# Patient Record
Sex: Male | Born: 1990
Health system: Southern US, Community
[De-identification: ages and names within clinical notes are randomized; demographics above are authoritative.]

## PROBLEM LIST (undated history)

## (undated) DIAGNOSIS — F419 Anxiety disorder, unspecified: Secondary | ICD-10-CM

## (undated) DIAGNOSIS — F988 Other specified behavioral and emotional disorders with onset usually occurring in childhood and adolescence: Secondary | ICD-10-CM

## (undated) HISTORY — DX: Other specified behavioral and emotional disorders with onset usually occurring in childhood and adolescence: F98.8

## (undated) HISTORY — DX: Anxiety disorder, unspecified: F41.9

## (undated) HISTORY — PX: MOUTH SURGERY: SHX715

## (undated) HISTORY — PX: FETAL SURGERY FOR CONGENITAL HERNIA: SHX1618

---
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
## 2009-10-25 ENCOUNTER — Ambulatory Visit: Payer: Self-pay | Admitting: Family Medicine

## 2010-07-10 ENCOUNTER — Ambulatory Visit: Payer: Self-pay | Admitting: Family Medicine

## 2013-02-09 ENCOUNTER — Other Ambulatory Visit (HOSPITAL_COMMUNITY): Payer: Self-pay | Admitting: Internal Medicine

## 2013-02-09 ENCOUNTER — Ambulatory Visit (HOSPITAL_COMMUNITY)
Admission: RE | Admit: 2013-02-09 | Discharge: 2013-02-09 | Disposition: A | Discharge status: Home / Self Care | Payer: BC Managed Care – PPO | Source: Ambulatory Visit | Attending: Internal Medicine | Admitting: Internal Medicine

## 2013-02-09 DIAGNOSIS — M79609 Pain in unspecified limb: Secondary | ICD-10-CM | POA: Insufficient documentation

## 2013-02-09 DIAGNOSIS — S92309A Fracture of unspecified metatarsal bone(s), unspecified foot, initial encounter for closed fracture: Secondary | ICD-10-CM

## 2014-07-01 DIAGNOSIS — F988 Other specified behavioral and emotional disorders with onset usually occurring in childhood and adolescence: Secondary | ICD-10-CM | POA: Insufficient documentation

## 2014-07-05 ENCOUNTER — Encounter: Payer: Self-pay | Admitting: Emergency Medicine

## 2014-07-06 ENCOUNTER — Ambulatory Visit (INDEPENDENT_AMBULATORY_CARE_PROVIDER_SITE_OTHER): Payer: BC Managed Care – PPO | Admitting: Internal Medicine

## 2014-07-06 ENCOUNTER — Encounter: Payer: Self-pay | Admitting: Internal Medicine

## 2014-07-06 VITALS — BP 118/68 | HR 72 | Temp 97.3°F | Resp 16 | Ht 71.0 in | Wt 123.6 lb

## 2014-07-06 DIAGNOSIS — Z0000 Encounter for general adult medical examination without abnormal findings: Secondary | ICD-10-CM

## 2014-07-06 DIAGNOSIS — R7401 Elevation of levels of liver transaminase levels: Secondary | ICD-10-CM

## 2014-07-06 DIAGNOSIS — R740 Nonspecific elevation of levels of transaminase and lactic acid dehydrogenase [LDH]: Secondary | ICD-10-CM

## 2014-07-06 DIAGNOSIS — F17200 Nicotine dependence, unspecified, uncomplicated: Secondary | ICD-10-CM

## 2014-07-06 DIAGNOSIS — F988 Other specified behavioral and emotional disorders with onset usually occurring in childhood and adolescence: Secondary | ICD-10-CM

## 2014-07-06 DIAGNOSIS — E559 Vitamin D deficiency, unspecified: Secondary | ICD-10-CM

## 2014-07-06 DIAGNOSIS — Z79899 Other long term (current) drug therapy: Secondary | ICD-10-CM

## 2014-07-06 DIAGNOSIS — Z1212 Encounter for screening for malignant neoplasm of rectum: Secondary | ICD-10-CM

## 2014-07-06 DIAGNOSIS — R7402 Elevation of levels of lactic acid dehydrogenase (LDH): Secondary | ICD-10-CM

## 2014-07-06 DIAGNOSIS — Z113 Encounter for screening for infections with a predominantly sexual mode of transmission: Secondary | ICD-10-CM

## 2014-07-06 MED ORDER — AMPHETAMINE-DEXTROAMPHET ER 30 MG PO CP24: 30.0000 mg | ORAL_CAPSULE | Freq: Every day | ORAL | Status: DC

## 2014-07-06 MED ORDER — VARENICLINE TARTRATE 1 MG PO TABS: 1.0000 mg | ORAL_TABLET | Freq: Two times a day (BID) | ORAL | Status: DC

## 2014-07-06 NOTE — Patient Instructions (Addendum)
Recommend the book "The END of DIETING" by Dr Baker Janus   and the book "The END of DIABETES " by Dr Excell Seltzer  At [email or web address] - get book & Audio CD's      Being diabetic has a  300% increased risk for heart attack, stroke, cancer, and alzheimer- type vascular dementia. It is very important that you work harder with diet by avoiding all foods that are white except chicken & fish. Avoid white rice (brown & wild rice is OK), white potatoes (sweetpotatoes in moderation is OK), White bread or wheat bread or anything made out of white flour like bagels, donuts, rolls, buns, biscuits, cakes, pastries, cookies, pizza crust, and pasta (made from white flour & egg whites) - vegetarian pasta or spinach or wheat pasta is OK. Multigrain breads like Arnold's or [employer], or multigrain sandwich thins or flatbreads.  Diet, exercise and weight loss can reverse and cure diabetes in the early stages.  Diet, exercise and weight loss is very important in the control and prevention of complications of diabetes which affects every system in your body, ie. Brain - dementia/stroke, eyes - glaucoma/blindness, heart - heart attack/heart failure, kidneys - dialysis, stomach - gastric paralysis, intestines - malabsorption, nerves - severe painful neuritis, circulation - gangrene & loss of a leg(s), and finally cancer and Alzheimers.    I recommend avoid fried & greasy foods,  sweets/candy, white rice (brown or wild rice or Quinoa is OK), white potatoes (sweet potatoes are OK) - anything made from white flour - bagels, doughnuts, rolls, buns, biscuits,white and wheat breads, pizza crust and traditional pasta made of white flour & egg white(vegetarian pasta or spinach or wheat pasta is OK).  Multi-grain bread is OK - like multi-grain flat bread or sandwich thins. Avoid alcohol in excess. Exercise is also important.    Eat all the vegetables you want - avoid meat, especially red meat and dairy - especially cheese.  Cheese  is the most concentrated form of trans-fats which is the worst thing to clog up our arteries. Veggie cheese is OK which can be found in the fresh produce section at [employer] or [employer] or [employer] 51-24 Years Old SCHOOL PERFORMANCE After high school, you may attend college or technical or vocational school, enroll in the [employer], or enter the workforce. PHYSICAL, SOCIAL, AND EMOTIONAL DEVELOPMENT  One hour of regular physical activity daily is recommended. Continue to participate in sports.  Develop your own interests and consider community service or volunteerism.  Make decisions about college and work plans.  Throughout these years, you should assume responsibility for your own health care. Increasing independence is important for you.  You may be exploring your sexual identity. Understand that you should never be in a situation that makes you feel uncomfortable, and tell your partner if you do not want to engage in sexual activity.  Body image may become important to you. Be mindful that eating disorders can develop at this time. Talk to your parents or other caregivers if you have concerns about body image, weight gain, or losing weight.  You may notice mood disturbances, depression, anxiety, attention problems, or trouble with alcohol. Talk to your health care provider if you have concerns about mental illness.  Set limits for yourself and talk with your parents or other caregivers about independent decision making.  Handle conflict without physical violence.  Avoid loud noises which may impair hearing.  Limit television and computer time to 2  hours each day. Individuals who engage in excessive inactivity are more likely to become overweight. RECOMMENDED IMMUNIZATIONS  Influenza vaccine.  All adults should be immunized every year.  All adults, including pregnant women and people with hives-only allergy to eggs, can receive the inactivated  influenza (IIV) vaccine.  Adults aged 18-49 years can receive the recombinant influenza (RIV) vaccine. The RIV vaccine does not contain any egg protein.  Tetanus, diphtheria, and acellular pertussis (Td, Tdap) vaccine.  Pregnant women should receive 1 dose of Tdap vaccine during each pregnancy. The dose should be obtained regardless of the length of time since the last dose. Immunization is preferred during the 27th to 36th week of gestation.  An adult who has not previously received Tdap or who does not know his or her vaccine status should receive 1 dose of Tdap. This initial dose should be followed by tetanus and diphtheria toxoids (Td) booster doses every 10 years.  Adults with an unknown or incomplete history of completing a 3-dose immunization series with Td-containing vaccines should begin or complete a primary immunization series including a Tdap dose.  Adults should receive a Td booster every 10 years.  Varicella vaccine.  An adult without evidence of immunity to varicella should receive 2 doses or a second dose if he or she has previously received 1 dose.  Pregnant females who do not have evidence of immunity should receive the first dose after pregnancy. This first dose should be obtained before leaving the health care facility. The second dose should be obtained 4-8 weeks after the first dose.  Human papillomavirus (HPV) vaccine.  Females aged 13-26 years who have not received the vaccine previously should obtain the 3-dose series.  The vaccine is not recommended for pregnant females. However, pregnancy testing is not needed before receiving a dose. If a male is found to be pregnant after receiving a dose, no treatment is needed. In that case, the remaining doses should be delayed until after the pregnancy.  Males aged 46-21 years who have not received the vaccine previously should receive the 3-dose series. Males aged 22-26 years may be immunized.  Immunization is  recommended through the age of 74 years for any male who has sex with males and did not get any or all doses earlier.  Immunization is recommended for any person with an immunocompromised condition through the age of 45 years if he or she did not get any or all doses earlier.  During the 3-dose series, the second dose should be obtained 4-8 weeks after the first dose. The third dose should be obtained 24 weeks after the first dose and 16 weeks after the second dose.  Measles, mumps, and rubella (MMR) vaccine.  Adults born in [birth year] or later should have 1 or more doses of MMR vaccine unless there is a contraindication to the vaccine or there is laboratory evidence of immunity to each of the three diseases.  A routine second dose of MMR vaccine should be obtained at least 28 days after the first dose for students attending postsecondary schools, health care workers, and international travelers.  For females of childbearing age, rubella immunity should be determined. If there is no evidence of immunity, females who are not pregnant should be vaccinated. If there is no evidence of immunity, females who are pregnant should delay immunization until after pregnancy.  Pneumococcal 13-valent conjugate (PCV13) vaccine.  When indicated, a person who is uncertain of his or her immunization history and has no record of immunization should  receive the PCV13 vaccine.  An adult aged 86 years or older who has certain medical conditions and has not been previously immunized should receive 1 dose of PCV13 vaccine. This PCV13 should be followed with a dose of pneumococcal polysaccharide (PPSV23) vaccine. The PPSV23 vaccine dose should be obtained at least 8 weeks after the dose of PCV13 vaccine.  An adult aged 87 years or older who has certain medical conditions and previously received 1 or more doses of PPSV23 vaccine should receive 1 dose of PCV13. The PCV13 vaccine dose should be obtained 1 or more years after the  last PPSV23 vaccine dose.  Pneumococcal polysaccharide (PPSV23) vaccine.  When PCV13 is also indicated, PCV13 should be obtained first.  An adult younger than age 66 years who has certain medical conditions should be immunized.  Any person who resides in a long-term care facility should be immunized.  An adult smoker should be immunized.  People with an immunocompromised condition and certain other conditions should receive both PCV13 and PPSV23 vaccines.  People with human immunodeficiency virus (HIV) infection should be immunized as soon as possible after diagnosis.  Immunization during chemotherapy or radiation therapy should be avoided.  Routine use of PPSV23 vaccine is not recommended for American Indians, [place] Natives, or people younger than 65 years unless there are medical conditions that require PPSV23 vaccine.  When indicated, people who have unknown immunization and have no record of immunization should receive PPSV23 vaccine.  One-time revaccination 5 years after the first dose of PPSV23 is recommended for people aged 19-64 years who have chronic kidney failure, nephrotic syndrome, asplenia, or immunocompromised conditions.  Meningococcal vaccine.  Adults with asplenia or persistent complement component deficiencies should receive 2 doses of quadrivalent meningococcal conjugate (MenACWY-D) vaccine. The doses should be obtained at least 2 months apart.  Microbiologists working with certain meningococcal bacteria, [place] recruits, people at risk during an outbreak, and people who travel to or live in countries with a high rate of meningitis should be immunized.  A first-year college student up through age 63 years who is living in a residence hall should receive a dose if he or she did not receive a dose on or after his or her 16th birthday.  Adults who have certain high-risk conditions should receive one or more doses of vaccine.  Hepatitis A vaccine.  Adults who  wish to be protected from this disease, have certain high-risk conditions, work with hepatitis A-infected animals, work in hepatitis A research labs, or travel to or work in countries with a high rate of hepatitis A should be immunized.  Adults who were previously unvaccinated and who anticipate close contact with an international adoptee during the first 60 days after arrival in the [place] from a country with a high rate of hepatitis A should be immunized.  Hepatitis B vaccine.  Adults who wish to be protected from this disease, have certain high-risk conditions, may be exposed to blood or other infectious body fluids, are household contacts or sex partners of hepatitis B positive people, are clients or workers in certain care facilities, or travel to or work in countries with a high rate of hepatitis B should be immunized.  Haemophilus influenzae type b (Hib) vaccine.  A previously unvaccinated person with asplenia or sickle cell disease or having a scheduled splenectomy should receive 1 dose of Hib vaccine.  Regardless of previous immunization, a recipient of a hematopoietic stem cell transplant should receive a 3-dose series 6-12 months after his or  her successful transplant.  Hib vaccine is not recommended for adults with HIV infection. TESTING  Annual screening for vision and hearing problems is recommended. Vision should be screened at least once between 39-57 years of age.  You may be screened for anemia or tuberculosis.  You should have a blood test to check for high cholesterol.  You should be screened for alcohol and drug use.  If you are sexually active, you may be screened for sexually transmitted infections (STIs), pregnancy, or HIV. You should be screened for STIs if:  Your sexual activity has changed since the last screening test, and you are at an increased risk for chlamydia or gonorrhea. Ask your health care provider if you are at risk.  If you are at an  increased risk for hepatitis B, you should be screened for this virus. You are considered at high risk for hepatitis B if you:  Were born in a country where hepatitis B occurs often. Talk with your health care provider about which countries are considered high risk.  Have parents who were born in a high-risk country and have not received a shot to protect against hepatitis B (hepatitis B vaccine).  Have HIV or AIDS.  Use needles to inject street drugs.  Live with or have sex with someone who has hepatitis B.  Are a man who has sex with other men (MSM).  Get hemodialysis treatment.  Take certain medicines for conditions like cancer, organ transplantation, or autoimmune conditions. NUTRITION   You should:  Have three servings of low-fat milk and dairy products daily. If you do not drink milk or consume dairy products, you should eat calcium-enriched foods, such as juice, bread, or cereal. Dark, leafy greens or canned fish are alternate sources of calcium.  Drink plenty of water. Fruit juice should be limited to 8-12 oz (240-360 mL) each day. Sugary beverages and sodas should be avoided.  Avoid eating foods high in fat, salt, or sugar, such as chips, candy, and cookies.  Avoid fast foods and limit eating out at restaurants.  Try not to skip meals, especially breakfast. You should eat a variety of vegetables, fruits, and lean meats.  Eat meals together as a family whenever possible. ORAL HEALTH Brush your teeth twice a day and floss at least once a day. You should have two dental exams a year.  SKIN CARE You should wear sunscreen when out in the sun. TALK TO SOMEONE ABOUT:  Precautions against pregnancy, contraception, and sexually transmitted infections.  Taking a prescription medicine daily to prevent HIV infection if you are at risk of being infected with HIV. This is called preexposure prophylaxis (PrEP). You are at risk if you:  Are a male who has sex with other males  (MSM).  Are heterosexual and sexually active with more than one partner.  Take drugs by injection.  Are sexually active with a partner who has HIV.  Whether you are at high risk of being infected with HIV. If you choose to begin PrEP, you should first be tested for HIV. You should then be tested every 3 months for as long as you are taking PrEP.  Drug, tobacco, and alcohol use among your friends or at friends' homes. Smoking tobacco or marijuana and taking drugs have health consequences and may impact your brain development.  Appropriate use of over-the-counter or prescription medicines.  Driving guidelines and riding with friends.  The risks of drinking and driving or boating. Call someone if you have been drinking  or using drugs and need a ride. WHAT'S NEXT? Visit your pediatrician or family physician once a year. By young adulthood, you should transition from your pediatrician to a family physician or internal medicine specialist. If you are a male and are sexually active, you may want to begin annual physical exams with a gynecologist. Document Released: 01/29/2007 Document Revised: 11/08/2013 Document Reviewed: 02/18/2007 [facility] Patient Information 2015 [place], [place]. This information is not intended to replace advice given to you by your health care provider. Make sure you discuss any questions you have with your health care provider.   Smoking Cessation Quitting smoking is important to your health and has many advantages. However, it is not always easy to quit since nicotine is a very addictive drug. Oftentimes, people try 3 times or more before being able to quit. This document explains the best ways for you to prepare to quit smoking. Quitting takes hard work and a lot of effort, but you can do it. ADVANTAGES OF QUITTING SMOKING  You will live longer, feel better, and live better.  Your body will feel the impact of quitting smoking almost immediately.  Within 20 minutes,  blood pressure decreases. Your pulse returns to its normal level.  After 8 hours, carbon monoxide levels in the blood return to normal. Your oxygen level increases.  After 24 hours, the chance of having a heart attack starts to decrease. Your breath, hair, and body stop smelling like smoke.  After 48 hours, damaged nerve endings begin to recover. Your sense of taste and smell improve.  After 72 hours, the body is virtually free of nicotine. Your bronchial tubes relax and breathing becomes easier.  After 2 to 12 weeks, lungs can hold more air. Exercise becomes easier and circulation improves.  The risk of having a heart attack, stroke, cancer, or lung disease is greatly reduced.  After 1 year, the risk of coronary heart disease is cut in half.  After 5 years, the risk of stroke falls to the same as a nonsmoker.  After 10 years, the risk of lung cancer is cut in half and the risk of other cancers decreases significantly.  After 15 years, the risk of coronary heart disease drops, usually to the level of a nonsmoker.  If you are pregnant, quitting smoking will improve your chances of having a healthy baby.  The people you live with, especially any children, will be healthier.  You will have extra money to spend on things other than cigarettes. QUESTIONS TO THINK ABOUT BEFORE ATTEMPTING TO QUIT You may want to talk about your answers with your health care provider.  Why do you want to quit?  If you tried to quit in the past, what helped and what did not?  What will be the most difficult situations for you after you quit? How will you plan to handle them?  Who can help you through the tough times? Your family? Friends? A health care provider?  What pleasures do you get from smoking? What ways can you still get pleasure if you quit? Here are some questions to ask your health care provider:  How can you help me to be successful at quitting?  What medicine do you think would be best  for me and how should I take it?  What should I do if I need more help?  What is smoking withdrawal like? How can I get information on withdrawal? GET READY  Set a quit date.  Change your environment by getting rid  of all cigarettes, ashtrays, matches, and lighters in your home, car, or work. Do not let people smoke in your home.  Review your past attempts to quit. Think about what worked and what did not. GET SUPPORT AND ENCOURAGEMENT You have a better chance of being successful if you have help. You can get support in many ways.  Tell your family, friends, and coworkers that you are going to quit and need their support. Ask them not to smoke around you.  Get individual, group, or telephone counseling and support. Programs are available at [employer] and health centers. Call your local health department for information about programs in your area.  Spiritual beliefs and practices may help some smokers quit.  Download a "quit meter" on your computer to keep track of quit statistics, such as how long you have gone without smoking, cigarettes not smoked, and money saved.  Get a self-help book about quitting smoking and staying off tobacco. [place] yourself from urges to smoke. Talk to someone, go for a walk, or occupy your time with a task.  Change your normal routine. Take a different route to work. Drink tea instead of coffee. Eat breakfast in a different place.  Reduce your stress. Take a hot bath, exercise, or read a book.  Plan something enjoyable to do every day. Reward yourself for not smoking.  Explore interactive web-based programs that specialize in helping you quit. GET MEDICINE AND USE IT CORRECTLY Medicines can help you stop smoking and decrease the urge to smoke. Combining medicine with the above behavioral methods and support can greatly increase your chances of successfully quitting smoking.  Nicotine replacement therapy helps  deliver nicotine to your body without the negative effects and risks of smoking. Nicotine replacement therapy includes nicotine gum, lozenges, inhalers, nasal sprays, and skin patches. Some may be available over-the-counter and others require a prescription.  Antidepressant medicine helps people abstain from smoking, but how this works is unknown. This medicine is available by prescription.  Nicotinic receptor partial agonist medicine simulates the effect of nicotine in your brain. This medicine is available by prescription. Ask your health care provider for advice about which medicines to use and how to use them based on your health history. Your health care provider will tell you what side effects to look out for if you choose to be on a medicine or therapy. Carefully read the information on the package. Do not use any other product containing nicotine while using a nicotine replacement product.  RELAPSE OR DIFFICULT SITUATIONS Most relapses occur within the first 3 months after quitting. Do not be discouraged if you start smoking again. Remember, most people try several times before finally quitting. You may have symptoms of withdrawal because your body is used to nicotine. You may crave cigarettes, be irritable, feel very hungry, cough often, get headaches, or have difficulty concentrating. The withdrawal symptoms are only temporary. They are strongest when you first quit, but they will go away within 10-14 days. To reduce the chances of relapse, try to:  Avoid drinking alcohol. Drinking lowers your chances of successfully quitting.  Reduce the amount of caffeine you consume. Once you quit smoking, the amount of caffeine in your body increases and can give you symptoms, such as a rapid heartbeat, sweating, and anxiety.  Avoid smokers because they can make you want to smoke.  Do not let weight gain distract you. Many smokers will gain weight when they quit, usually less than  10 pounds. Eat a  healthy diet and stay active. You can always lose the weight gained after you quit.  Find ways to improve your mood other than smoking. FOR MORE INFORMATION  www.smokefree.gov  Document Released: 10/28/2001 Document Revised: 03/20/2014 Document Reviewed: 02/12/2012 [facility] Patient Information 2015 [place], [place]. This information is not intended to replace advice given to you by your health care provider. Make sure you discuss any questions you have with your health care provider.  Smoking Cessation, Tips for Success If you are ready to quit smoking, congratulations! You have chosen to help yourself be healthier. Cigarettes bring nicotine, tar, carbon monoxide, and other irritants into your body. Your lungs, heart, and blood vessels will be able to work better without these poisons. There are many different ways to quit smoking. Nicotine gum, nicotine patches, a nicotine inhaler, or nicotine nasal spray can help with physical craving. Hypnosis, support groups, and medicines help break the habit of smoking. WHAT THINGS CAN I DO TO MAKE QUITTING EASIER?  Here are some tips to help you quit for good:  Pick a date when you will quit smoking completely. Tell all of your friends and family about your plan to quit on that date.  Do not try to slowly cut down on the number of cigarettes you are smoking. Pick a quit date and quit smoking completely starting on that day.  Throw away all cigarettes.   Clean and remove all ashtrays from your home, work, and car.  On a card, write down your reasons for quitting. Carry the card with you and read it when you get the urge to smoke.  Cleanse your body of nicotine. Drink enough water and fluids to keep your urine clear or pale yellow. Do this after quitting to flush the nicotine from your body.  Learn to predict your moods. Do not let a bad situation be your excuse to have a cigarette. Some situations in your life might tempt you into wanting a  cigarette.  Never have "just one" cigarette. It leads to wanting another and another. Remind yourself of your decision to quit.  Change habits associated with smoking. If you smoked while driving or when feeling stressed, try other activities to replace smoking. Stand up when drinking your coffee. Brush your teeth after eating. Sit in a different chair when you read the paper. Avoid alcohol while trying to quit, and try to drink fewer caffeinated beverages. Alcohol and caffeine may urge you to smoke.  Avoid foods and drinks that can trigger a desire to smoke, such as sugary or spicy foods and alcohol.  Ask people who smoke not to smoke around you.  Have something planned to do right after eating or having a cup of coffee. For example, plan to take a walk or exercise.  Try a relaxation exercise to calm you down and decrease your stress. Remember, you may be tense and nervous for the first 2 weeks after you quit, but this will pass.  Find new activities to keep your hands busy. Play with a pen, coin, or rubber band. Doodle or draw things on paper.  Brush your teeth right after eating. This will help cut down on the craving for the taste of tobacco after meals. You can also try mouthwash.   Use oral substitutes in place of cigarettes. Try using lemon drops, carrots, cinnamon sticks, or chewing gum. Keep them handy so they are available when you have the urge to smoke.  When you have the urge  to smoke, try deep breathing.  Designate your home as a nonsmoking area.  If you are a heavy smoker, ask your health care provider about a prescription for nicotine chewing gum. It can ease your withdrawal from nicotine.  Reward yourself. Set aside the cigarette money you save and buy yourself something nice.  Look for support from others. Join a support group or smoking cessation program. Ask someone at home or at work to help you with your plan to quit smoking.  Always ask yourself, "Do I need this  cigarette or is this just a reflex?" Tell yourself, "Today, I choose not to smoke," or "I do not want to smoke." You are reminding yourself of your decision to quit.  Do not replace cigarette smoking with electronic cigarettes (commonly called e-cigarettes). The safety of e-cigarettes is unknown, and some may contain harmful chemicals.  If you relapse, do not give up! Plan ahead and think about what you will do the next time you get the urge to smoke. HOW WILL I FEEL WHEN I QUIT SMOKING? You may have symptoms of withdrawal because your body is used to nicotine (the addictive substance in cigarettes). You may crave cigarettes, be irritable, feel very hungry, cough often, get headaches, or have difficulty concentrating. The withdrawal symptoms are only temporary. They are strongest when you first quit but will go away within 10-14 days. When withdrawal symptoms occur, stay in control. Think about your reasons for quitting. Remind yourself that these are signs that your body is healing and getting used to being without cigarettes. Remember that withdrawal symptoms are easier to treat than the major diseases that smoking can cause.  Even after the withdrawal is over, expect periodic urges to smoke. However, these cravings are generally short lived and will go away whether you smoke or not. Do not smoke! WHAT RESOURCES ARE AVAILABLE TO HELP ME QUIT SMOKING? Your health care provider can direct you to community resources or hospitals for support, which may include:  Group support.  Education.  Hypnosis.  Therapy. Document Released: 08/01/2004 Document Revised: 03/20/2014 Document Reviewed: 04/21/2013 [facility] Patient Information 2015 [place], [place]. This information is not intended to replace advice given to you by your health care provider. Make sure you discuss any questions you have with your health care provider.

## 2014-07-06 NOTE — Progress Notes (Signed)
Patient ID: Austin [name], male   DOB: 10/05/1991, 23 y.o.   MRN: [id number]  Annual Screening Comprehensive Examination   This very nice 23 y.o.male presents for complete physical.  Patient has no major health issues. He does relate hx/o ADD initially Dx'd at age 68 yo and was treaed til age 1 yo- then stopping medications. Meds were resumed at age 9 yo and now he reports he has been off of his Adderall for about 6 months and feels that his school performance has deteriorated. Sx's are difficulty focusing, concentrating and staying on task.    No current outpatient prescriptions on file prior to visit.   No Known Allergies  Past Medical History  Diagnosis Date  . ADD (attention deficit disorder)    Past Surgical History  Procedure Laterality Date  . Mouth surgery     Family history - non contributatory.  History   Social History  . Marital Status: Married    Spouse Name: N/A    Number of Children: N/A  . Years of Education: N/A   Occupational History  . Student & working part time at [employer]   Social History Main Topics  . Smoking status: Current Every Day Smoker -- 1.00 packs/day    Types: Cigarettes  . Smokeless tobacco: Never Used  . Alcohol Use: Yes     Comment: rarely  . Drug Use: No  . Sexual Activity: Not on file    ROS Constitutional: Denies fever, chills, weight loss/gain, headaches, insomnia, fatigue, night sweats, and change in appetite. Eyes: Denies redness, blurred vision, diplopia, discharge, itchy, watery eyes.  ENT: Denies discharge, congestion, post nasal drip, epistaxis, sore throat, earache, hearing loss, dental pain, Tinnitus, Vertigo, Sinus pain, snoring.  Cardio: Denies chest pain, palpitations, irregular heartbeat, syncope, dyspnea, diaphoresis, orthopnea, PND, claudication, edema Respiratory: denies cough, dyspnea, DOE, pleurisy, hoarseness, laryngitis, wheezing.  Gastrointestinal: Denies dysphagia, heartburn, reflux, water brash, pain, cramps,  nausea, vomiting, bloating, diarrhea, constipation, hematemesis, melena, hematochezia, jaundice, hemorrhoids Genitourinary: Denies dysuria, frequency, urgency, nocturia, hesitancy, discharge, hematuria, flank pain Breast: Breast lumps, nipple discharge, bleeding.  Musculoskeletal: Denies arthralgia, myalgia, stiffness, Jt. Swelling, pain, limp, and strain/sprain. Skin: Denies puritis, rash, hives, warts, acne, eczema, changing in skin lesion Neuro: Weakness, tremor, incoordination, spasms, paresthesia, pain Psychiatric: Denies confusion, memory loss, sensory loss. Endocrine: Denies change in weight, skin, hair change, nocturia, and paresthesia, diabetic polys, visual blurring, hyper /hypo glycemic episodes.  Heme/Lymph: No excessive bleeding, bruising, enlarged lymph nodes.  Physical Exam  BP 118/68  P 72  T 97.3 F   Resp 16  Ht 5\' 11"    Wt 123 lb 9.6 oz   BMI 17.25   General Appearance: Well nourished and in no apparent distress. Eyes: PERRLA, EOMs, conjunctiva no swelling or erythema, normal fundi and vessels. Sinuses: No frontal/maxillary tenderness ENT/Mouth: EACs patent / TMs  nl. Nares clear without erythema, swelling, mucoid exudates. Oral hygiene is good. No erythema, swelling, or exudate. Tongue normal, non-obstructing. Tonsils not swollen or erythematous. Hearing normal.  Neck: Supple, thyroid normal. No bruits, nodes or JVD. Respiratory: Respiratory effort normal.  BS equal and clear bilateral without rales, rhonci, wheezing or stridor. Cardio: Heart sounds are normal with regular rate and rhythm and no murmurs, rubs or gallops. Peripheral pulses are normal and equal bilaterally without edema. No aortic or femoral bruits. Chest: symmetric with normal excursions and percussion. Abdomen: Flat, soft, with bowl sounds. Nontender, no guarding, rebound, hernias, masses, or organomegaly.  Lymphatics: Non tender without lymphadenopathy.  Genitourinary:  Musculoskeletal: Full ROM  all peripheral extremities, joint stability, 5/5 strength, and normal gait. Skin: Warm and dry without rashes, lesions, cyanosis, clubbing or  ecchymosis.  Neuro: Cranial nerves intact, reflexes equal bilaterally. Normal muscle tone, no cerebellar symptoms. Sensation intact.  Pysch: Awake and oriented X 3, normal affect, Insight and Judgment appropriate.   Assessment and Plan  1. Annual Screening Examination 2. ADD  Continue prudent diet as discussed, weight control, regular exercise, and will restart his Adderall 30 mg XR when he return to school in the fall. Currently he is smoking and is agreeable to treatment with Chantix for smoking cessation.. Routine screening labs and tests as requested with regular follow-up as recommended.

## 2014-07-06 NOTE — Progress Notes (Deleted)
   Subjective:    Patient ID: Austin BestJamie [name], male    DOB: 07/23/1991, 23 y.o.   MRN: [id number]  HPI    Review of Systems     Objective:   Physical Exam        Assessment & Plan:

## 2014-07-07 LAB — CBC WITH DIFFERENTIAL/PLATELET
BASOS ABS: 0.1 10*3/uL (ref 0.0–0.1)
BASOS PCT: 1 % (ref 0–1)
EOS PCT: 1 % (ref 0–5)
Eosinophils Absolute: 0.1 10*3/uL (ref 0.0–0.7)
HEMATOCRIT: 47 % (ref 39.0–52.0)
Hemoglobin: 16.5 g/dL (ref 13.0–17.0)
LYMPHS PCT: 21 % (ref 12–46)
Lymphs Abs: 1.8 10*3/uL (ref 0.7–4.0)
MCH: 32.4 pg (ref 26.0–34.0)
MCHC: 35.1 g/dL (ref 30.0–36.0)
MCV: 92.3 fL (ref 78.0–100.0)
MONOS PCT: 11 % (ref 3–12)
Monocytes Absolute: 0.9 10*3/uL (ref 0.1–1.0)
NEUTROS PCT: 66 % (ref 43–77)
Neutro Abs: 5.5 10*3/uL (ref 1.7–7.7)
PLATELETS: 216 10*3/uL (ref 150–400)
RBC: 5.09 MIL/uL (ref 4.22–5.81)
RDW: 12.8 % (ref 11.5–15.5)
WBC: 8.4 10*3/uL (ref 4.0–10.5)

## 2014-07-07 LAB — URINALYSIS, MICROSCOPIC ONLY
BACTERIA UA: NONE SEEN
CASTS: NONE SEEN
CRYSTALS: NONE SEEN
SQUAMOUS EPITHELIAL / LPF: NONE SEEN

## 2014-07-07 LAB — MICROALBUMIN / CREATININE URINE RATIO
Creatinine, Urine: 111 mg/dL
Microalb Creat Ratio: 63.8 mg/g — ABNORMAL HIGH (ref 0.0–30.0)
Microalb, Ur: 7.08 mg/dL — ABNORMAL HIGH (ref 0.00–1.89)

## 2014-07-10 LAB — HEMOGLOBIN A1C
Hgb A1c MFr Bld: 4.9 % (ref <5.7)
Mean Plasma Glucose: 94 mg/dL (ref <117)

## 2014-07-11 LAB — BASIC METABOLIC PANEL WITH GFR
BUN: 13 mg/dL (ref 6–23)
CHLORIDE: 102 meq/L (ref 96–112)
CO2: 29 meq/L (ref 19–32)
Calcium: 10.4 mg/dL (ref 8.4–10.5)
Creat: 1.08 mg/dL (ref 0.50–1.35)
GFR, Est Non African American: >89 mL/min
GLUCOSE: 113 mg/dL — AB (ref 70–99)
POTASSIUM: 3.3 meq/L — AB (ref 3.5–5.3)
SODIUM: 139 meq/L (ref 135–145)

## 2014-07-11 LAB — LIPID PANEL
Cholesterol: 133 mg/dL (ref 0–200)
HDL: 51 mg/dL (ref >39)
LDL CALC: 57 mg/dL (ref 0–99)
Total CHOL/HDL Ratio: 2.6 Ratio
Triglycerides: 125 mg/dL (ref <150)
VLDL: 25 mg/dL (ref 0–40)

## 2014-07-11 LAB — RPR

## 2014-07-11 LAB — VITAMIN B12: VITAMIN B 12: 1089 pg/mL — AB (ref 211–911)

## 2014-07-11 LAB — HEPATIC FUNCTION PANEL
ALT: 11 U/L (ref 0–53)
AST: 19 U/L (ref 0–37)
Albumin: 5.2 g/dL (ref 3.5–5.2)
Alkaline Phosphatase: 76 U/L (ref 39–117)
Bilirubin, Direct: 0.2 mg/dL (ref 0.0–0.3)
Indirect Bilirubin: 0.7 mg/dL (ref 0.2–1.2)
TOTAL PROTEIN: 7.5 g/dL (ref 6.0–8.3)
Total Bilirubin: 0.9 mg/dL (ref 0.2–1.2)

## 2014-07-11 LAB — MAGNESIUM: MAGNESIUM: 2.1 mg/dL (ref 1.5–2.5)

## 2014-07-11 LAB — VITAMIN D 25 HYDROXY (VIT D DEFICIENCY, FRACTURES): Vit D, 25-Hydroxy: 47 ng/mL (ref 30–89)

## 2014-07-11 LAB — HEPATITIS B SURFACE ANTIBODY,QUALITATIVE: Hep B S Ab: NEGATIVE

## 2014-07-11 LAB — TESTOSTERONE: TESTOSTERONE: 470 ng/dL (ref 300–890)

## 2014-07-11 LAB — HEPATITIS C ANTIBODY: HCV Ab: NEGATIVE

## 2014-07-11 LAB — HIV ANTIBODY (ROUTINE TESTING W REFLEX): HIV 1&2 Ab, 4th Generation: NONREACTIVE

## 2014-07-11 LAB — TSH: TSH: 1.008 u[IU]/mL (ref 0.350–4.500)

## 2014-07-11 LAB — HEPATITIS A ANTIBODY, TOTAL: HEP A TOTAL AB: BORDERLINE — AB

## 2014-07-11 LAB — HEPATITIS B CORE ANTIBODY, TOTAL: Hep B Core Total Ab: NONREACTIVE

## 2014-07-12 LAB — HEPATITIS B E ANTIBODY: HEPATITIS BE ANTIBODY: NONREACTIVE

## 2014-07-14 LAB — INSULIN, FASTING

## 2014-08-25 ENCOUNTER — Emergency Department (HOSPITAL_COMMUNITY)
Admission: EM | Admit: 2014-08-25 | Discharge: 2014-08-25 | Disposition: A | Discharge status: Home / Self Care | Payer: BC Managed Care – PPO | Source: Home / Self Care | Attending: Emergency Medicine | Admitting: Emergency Medicine

## 2014-08-25 ENCOUNTER — Encounter (HOSPITAL_COMMUNITY): Payer: Self-pay | Admitting: Emergency Medicine

## 2014-08-25 DIAGNOSIS — F410 Panic disorder [episodic paroxysmal anxiety] without agoraphobia: Secondary | ICD-10-CM

## 2014-08-25 MED ORDER — LORAZEPAM 2 MG/ML IJ SOLN: 2.0000 mg | Freq: Once | INTRAMUSCULAR | Status: DC

## 2014-08-25 MED ORDER — LORAZEPAM 2 MG/ML IJ SOLN
INTRAMUSCULAR | Status: AC
  Filled 2014-08-25: qty 1

## 2014-08-25 MED ORDER — CLONAZEPAM 1 MG PO TABS: 1.0000 mg | ORAL_TABLET | Freq: Two times a day (BID) | ORAL | Status: DC

## 2014-08-25 MED ORDER — LORAZEPAM 2 MG/ML IJ SOLN
2.0000 mg | Freq: Once | INTRAMUSCULAR | Status: AC
  Administered 2014-08-25: 2 mg via INTRAMUSCULAR

## 2014-08-25 NOTE — ED Notes (Signed)
C/o panic attacks onset 1 week ago and this one started @1900 . States his hands and legs were trembling. Started Chantix 1 month ago.  Stopped Chantix last night.  Tachycardia.

## 2014-08-25 NOTE — Discharge Instructions (Signed)

## 2014-08-25 NOTE — ED Provider Notes (Addendum)
Chief Complaint   Panic Attack   History of Present Illness   Austin [name] is a 23 year old male who had been taking Chantix for a month prescribed by his primary care physician. He been doing well up until the past week when he developed what seemed to him to be panic attacks every evening. These would last about an hour or 2. They're characterized by rapid heartbeat, shortness of breath, tremor, and dry mouth. He denies any chest pain, headache, paresthesias, dizziness, lightheadedness, loss of consciousness, abdominal pain, nausea, vomiting, or diarrhea. He denies any depression, suicidal ideation, or homicidal ideation. He's never had panic attacks before. He stopped taking the Chantix yesterday.  Review of Systems   Other than as noted above, the patient denies any of the following symptoms: Systemic:  No fever, chills, fatigue, weight loss or gain. Resp:  No shortness of breath. Cardiovasc:  No chest pain, palpitations, dizziness, or syncope. GI:  No abdominal pain, nausea, vomiting, anorexia, diarrhea, or constipation. Neuro:  No headache, paresthesias, or tremor. Psych:  No sadness, depression, crying, anxiety, panic, sleep disturbance, or suicidal or homicidal ideation.  No hallucinations or delusions.  PMFSH   Past medical history, family history, social history, meds, and allergies were reviewed.    Physical Examination     Vital signs:  BP 131/80  Pulse 123  Temp(Src) 99.6 F (37.6 C) (Oral)  Resp 20  SpO2 100% Gen:  Alert, oriented, in no distress. Lungs:  No respiratory distress.  Breath sounds clear and equal bilaterally.  No wheezes, rales, or rhonchi. Heart:  Regular rthythm.  No gallops, murmers, clicks or rubs. Abdomen:  Soft, flat and nontender.  No organomegaly or mass. Neuro:  Alert and oriented times 3. Speech clear, fluent and appropriate.  Cranial nerves intact.  No focal weakness. Psych:  Mood and affect normal.  Speech pattern normal.  Thought content  normal with no suicidal or homicidal ideation.  No paranoia, hallucinations, or delusions.  Memory, insight, and judgement normal. Appears nervous and anxious and somewhat tremulous.  EKG Results:  Date: 08/25/2014  Rate: 129  Rhythm: sinus tachycardia  QRS Axis: right--148  Intervals: normal  ST/T Wave abnormalities: normal  Conduction Disutrbances:none  Narrative Interpretation: Sinus tachycardia, right atrial enlargement, right axis deviation, pulmonary disease pattern, otherwise normal EKG.  Old EKG Reviewed: none available   Course in Urgent Care Center   He was given lorazepam 2 mg IM and also medially thereafter stated he felt better. His tremulousness went away and he felt a lot less anxious.  Assessment   The encounter diagnosis was Panic attack.   Probably brought on by the Chantix.  Plan   1.  Meds:  The following meds were prescribed:   Discharge Medication List as of 08/25/2014  8:42 PM    START taking these medications   Details  clonazePAM (KLONOPIN) 1 MG tablet Take 1 tablet (1 mg total) by mouth 2 (two) times daily., Starting 08/25/2014, Until Discontinued, Print        2.  Patient Education/Counseling:  The patient was given appropriate handouts, self care instructions, and instructed in symptomatic relief.  Should leave the Chantix off and probably should never take it again. Less than discuss this with his primary care physician. Followup with his PCP within the next week.  3.  Follow up:  The patient was told to follow up if no better in 3 to 4 days, if becoming worse in any way, and given some red flag symptoms  such as worsening symptoms or suicidal ideation which would prompt immediate return.       Reuben Likesavid C Angeliki Mates, MD 08/25/14 2107  Reuben Likesavid C Mari Battaglia, MD 08/25/14 2109

## 2014-09-01 ENCOUNTER — Encounter: Payer: Self-pay | Admitting: Internal Medicine

## 2014-09-01 ENCOUNTER — Ambulatory Visit (INDEPENDENT_AMBULATORY_CARE_PROVIDER_SITE_OTHER): Payer: BC Managed Care – PPO | Admitting: Internal Medicine

## 2014-09-01 VITALS — BP 122/74 | HR 88 | Temp 98.1°F | Resp 16 | Ht 71.0 in | Wt 123.0 lb

## 2014-09-01 DIAGNOSIS — R002 Palpitations: Secondary | ICD-10-CM

## 2014-09-01 NOTE — Progress Notes (Signed)
   Subjective:    Patient ID: Austin [name], male    DOB: 02/09/1991, 23 y.o.   MRN: [id number]  HPI Very nice man seen for CPE about 2 mo ago & prescribed Adderall for ADD and Chantix for smoking cessation.  Patient had been on Chantix for > 1 month w/o consequence and 1 week ago went to [facility] with palpitations and panic sx's & was  Dx'd with a panic attack  and speculated by provider that may have been due to Chantix despite that he had been on it for a while w/o problems. He was given Klonopin to use for panic Sx's. Patient does relate hx/o intermittent anxiety in past, but no prior panic attacks. Patient denies any recent stressors.   Medication List   clonazePAM 1 MG tablet  Commonly known as:  KLONOPIN  Take 1 tablet (1 mg total) by mouth 2 (two) times daily.     No Known Allergies  Past Medical History  Diagnosis Date  . ADD (attention deficit disorder)    Review of Systems Neg except as above.  Objective:   Physical Exam  BP 122/74  Pulse 88  Temp(Src) 98.1 F (36.7 C) (Temporal)  Resp 16  Ht 5\' 11"  (1.803 m)  Wt 123 lb (55.792 kg)  BMI 17.16 kg/m2  HEENT - Eac's patent. TM's Nl. EOM's full. PERRLA. NasoOroPharynx clear. Neck - supple. Nl Thyroid. Carotids 2+ & No bruits, nodes, JVD Chest - Clear equal BS w/o Rales, rhonchi, wheezes. Cor - Nl HS. RRR w/o sig MGR. PP 1(+). No edema. MS- FROM w/o deformities. Muscle power, tone and bulk Nl. Gait Nl. Neuro - No obvious Cr N abnormalities. Sensory, motor and Cerebellar functions appear Nl w/o focal abnormalities. Psyche - Mental status normal & appropriate.  No delusions, ideations or obvious mood abnormalities.  Assessment & Plan:   1. Palpitations  - Advised OK to D/C Klonopin due to short duration of use  & only use prn. - Discussed anxiety/panic sx's with patient and if develops freq sx's , he can be treated with a maintance med (SSRIs)

## 2014-09-01 NOTE — Patient Instructions (Signed)

## 2014-09-07 ENCOUNTER — Ambulatory Visit: Payer: Self-pay | Admitting: Internal Medicine

## 2014-09-08 ENCOUNTER — Other Ambulatory Visit: Payer: Self-pay | Admitting: Internal Medicine

## 2014-09-08 MED ORDER — CITALOPRAM HYDROBROMIDE 40 MG PO TABS: ORAL_TABLET | ORAL | Status: DC

## 2015-07-10 ENCOUNTER — Encounter: Payer: Self-pay | Admitting: Internal Medicine

## 2015-08-09 ENCOUNTER — Encounter: Payer: Self-pay | Admitting: Internal Medicine

## 2015-09-12 ENCOUNTER — Encounter: Payer: Self-pay | Admitting: Internal Medicine

## 2016-07-31 ENCOUNTER — Encounter: Payer: Self-pay | Admitting: Internal Medicine

## 2016-08-21 ENCOUNTER — Encounter: Payer: Self-pay | Admitting: Internal Medicine

## 2016-10-02 ENCOUNTER — Ambulatory Visit: Payer: Self-pay | Admitting: Internal Medicine

## 2016-10-02 NOTE — Progress Notes (Signed)
Error scheduling

## 2016-10-02 NOTE — Patient Instructions (Addendum)
Preventive Care for Adults  A healthy lifestyle and preventive care can promote health and wellness. Preventive health guidelines for men include the following key practices:  A routine yearly physical is a good way to check with your health care provider about your health and preventative screening. It is a chance to share any concerns and updates on your health and to receive a thorough exam.  Visit your dentist for a routine exam and preventative care every 6 months. Brush your teeth twice a day and floss once a day. Good oral hygiene prevents tooth decay and gum disease.  The frequency of eye exams is based on your age, health, family medical history, use of contact lenses, and other factors. Follow your health care provider's recommendations for frequency of eye exams.  Eat a healthy diet. Foods such as vegetables, fruits, whole grains, low-fat dairy products, and lean protein foods contain the nutrients you need without too many calories. Decrease your intake of foods high in solid fats, added sugars, and salt. Eat the right amount of calories for you.Get information about a proper diet from your health care provider, if necessary.  Regular physical exercise is one of the most important things you can do for your health. Most adults should get at least 150 minutes of moderate-intensity exercise (any activity that increases your heart rate and causes you to sweat) each week. In addition, most adults need muscle-strengthening exercises on 2 or more days a week.  Maintain a healthy weight. The body mass index (BMI) is a screening tool to identify possible weight problems. It provides an estimate of body fat based on height and weight. Your health care provider can find your BMI and can help you achieve or maintain a healthy weight.For adults 20 years and older:  A BMI below 18.5 is considered underweight.  A BMI of 18.5 to 24.9 is normal.  A BMI of 25 to 29.9 is considered overweight.  A  BMI of 30 and above is considered obese.  Maintain normal blood lipids and cholesterol levels by exercising and minimizing your intake of saturated fat. Eat a balanced diet with plenty of fruit and vegetables. Blood tests for lipids and cholesterol should begin at age 20 and be repeated every 5 years. If your lipid or cholesterol levels are high, you are over 50, or you are at high risk for heart disease, you may need your cholesterol levels checked more frequently.Ongoing high lipid and cholesterol levels should be treated with medicines if diet and exercise are not working.  If you smoke, find out from your health care provider how to quit. If you do not use tobacco, do not start.  Lung cancer screening is recommended for adults aged 55-80 years who are at high risk for developing lung cancer because of a history of smoking. A yearly low-dose CT scan of the lungs is recommended for people who have at least a 30-pack-year history of smoking and are a current smoker or have quit within the past 15 years. A pack year of smoking is smoking an average of 1 pack of cigarettes a day for 1 year (for example: 1 pack a day for 30 years or 2 packs a day for 15 years). Yearly screening should continue until the smoker has stopped smoking for at least 15 years. Yearly screening should be stopped for people who develop a health problem that would prevent them from having lung cancer treatment.  If you choose to drink alcohol, do not have more   than 2 drinks per day. One drink is considered to be 12 ounces (355 mL) of beer, 5 ounces (148 mL) of wine, or 1.5 ounces (44 mL) of liquor.  High blood pressure causes heart disease and increases the risk of stroke. Your blood pressure should be checked. Ongoing high blood pressure should be treated with medicines, if weight loss and exercise are not effective.  If you are 38-65 years old, ask your health care provider if you should take aspirin to prevent heart  disease.  Diabetes screening involves taking a blood sample to check your fasting blood sugar level. Testing should be considered at a younger age or be carried out more frequently if you are overweight and have at least 1 risk factor for diabetes.  Colorectal cancer can be detected and often prevented. Most routine colorectal cancer screening begins at the age of 56 and continues through age 26. However, your health care provider may recommend screening at an earlier age if you have risk factors for colon cancer. On a yearly basis, your health care provider may provide home test kits to check for hidden blood in the stool. Use of a small camera at the end of a tube to directly examine the colon (sigmoidoscopy or colonoscopy) can detect the earliest forms of colorectal cancer. Talk to your health care provider about this at age 96, when routine screening begins. Direct exam of the colon should be repeated every 5-10 years through age 69, unless early forms of precancerous polyps or small growths are found.  Screening for abdominal aortic aneurysm (AAA)  are recommended for persons over age 25 who have history of hypertensionor who are current or former smokers.  Talk with your health care provider about prostate cancer screening.  Testicular cancer screening is recommended for adult males. Screening includes self-exam, a health care provider exam, and other screening tests. Consult with your health care provider about any symptoms you have or any concerns you have about testicular cancer.  Use sunscreen. Apply sunscreen liberally and repeatedly throughout the day. You should seek shade when your shadow is shorter than you. Protect yourself by wearing long sleeves, pants, a wide-brimmed hat, and sunglasses year round, whenever you are outdoors.  Once a month, do a whole-body skin exam, using a mirror to look at the skin on your back. Tell your health care provider about new moles, moles that have  irregular borders, moles that are larger than a pencil eraser, or moles that have changed in shape or color.  Stay current with required vaccines (immunizations).  Influenza vaccine. All adults should be immunized every year.  Tetanus, diphtheria, and acellular pertussis (Td, Tdap) vaccine. An adult who has not previously received Tdap or who does not know his vaccine status should receive 1 dose of Tdap. This initial dose should be followed by tetanus and diphtheria toxoids (Td) booster doses every 10 years. Adults with an unknown or incomplete history of completing a 3-dose immunization series with Td-containing vaccines should begin or complete a primary immunization series including a Tdap dose. Adults should receive a Td booster every 10 years.  Zoster vaccine. One dose is recommended for adults aged 26 years or older unless certain conditions are present.    Pneumococcal 13-valent conjugate (PCV13) vaccine. When indicated, a person who is uncertain of his immunization history and has no record of immunization should receive the PCV13 vaccine. An adult aged 52 years or older who has certain medical conditions and has not been previously immunized  should receive 1 dose of PCV13 vaccine. This PCV13 should be followed with a dose of pneumococcal polysaccharide (PPSV23) vaccine. The PPSV23 vaccine dose should be obtained at least 8 weeks after the dose of PCV13 vaccine. An adult aged 26 years or older who has certain medical conditions and previously received 1 or more doses of PPSV23 vaccine should receive 1 dose of PCV13. The PCV13 vaccine dose should be obtained 1 or more years after the last PPSV23 vaccine dose.    Pneumococcal polysaccharide (PPSV23) vaccine. When PCV13 is also indicated, PCV13 should be obtained first. All adults aged 26 years and older should be immunized. An adult younger than age 26 years who has certain medical conditions should be immunized. Any person who resides in a  nursing home or long-term care facility should be immunized. An adult smoker should be immunized. People with an immunocompromised condition and certain other conditions should receive both PCV13 and PPSV23 vaccines. People with human immunodeficiency virus (HIV) infection should be immunized as soon as possible after diagnosis. Immunization during chemotherapy or radiation therapy should be avoided. Routine use of PPSV23 vaccine is not recommended for American Indians, [address] [place], or people younger than 65 years unless there are medical conditions that require PPSV23 vaccine. When indicated, people who have unknown immunization and have no record of immunization should receive PPSV23 vaccine. One-time revaccination 5 years after the first dose of PPSV23 is recommended for people aged 19-64 years who have chronic kidney failure, nephrotic syndrome, asplenia, or immunocompromised conditions. People who received 1-2 doses of PPSV23 before age 26 years should receive another dose of PPSV23 vaccine at age 26 years or later if at least 5 years have passed since the previous dose. Doses of PPSV23 are not needed for people immunized with PPSV23 at or after age 26 years.  Hepatitis A vaccine. Adults who wish to be protected from this disease, have certain high-risk conditions, work with hepatitis A-infected animals, work in hepatitis A research labs, or travel to or work in countries with a high rate of hepatitis A should be immunized. Adults who were previously unvaccinated and who anticipate close contact with an international adoptee during the first 60 days after arrival in the [place] from a country with a high rate of hepatitis A should be immunized.  Hepatitis B vaccine. Adults should be immunized if they wish to be protected from this disease, have certain high-risk conditions, may be exposed to blood or other infectious body fluids, are household contacts or sex partners of hepatitis B positive  people, are clients or workers in certain care facilities, or travel to or work in countries with a high rate of hepatitis B.

## 2016-12-26 DIAGNOSIS — Z79899 Other long term (current) drug therapy: Secondary | ICD-10-CM | POA: Diagnosis not present

## 2017-05-15 ENCOUNTER — Encounter (HOSPITAL_COMMUNITY): Payer: Self-pay | Admitting: Emergency Medicine

## 2017-05-15 ENCOUNTER — Encounter: Payer: Self-pay | Admitting: Nurse Practitioner

## 2017-05-15 ENCOUNTER — Ambulatory Visit: Payer: Self-pay | Admitting: Nurse Practitioner

## 2017-05-15 ENCOUNTER — Ambulatory Visit (HOSPITAL_COMMUNITY)
Admission: EM | Admit: 2017-05-15 | Discharge: 2017-05-15 | Disposition: A | Discharge status: Home / Self Care | Payer: Self-pay | Attending: Internal Medicine | Admitting: Internal Medicine

## 2017-05-15 VITALS — BP 128/66 | HR 100 | Temp 99.6°F | Wt 120.0 lb

## 2017-05-15 DIAGNOSIS — F17210 Nicotine dependence, cigarettes, uncomplicated: Secondary | ICD-10-CM | POA: Insufficient documentation

## 2017-05-15 DIAGNOSIS — J029 Acute pharyngitis, unspecified: Secondary | ICD-10-CM | POA: Insufficient documentation

## 2017-05-15 DIAGNOSIS — R1084 Generalized abdominal pain: Secondary | ICD-10-CM

## 2017-05-15 DIAGNOSIS — K5900 Constipation, unspecified: Secondary | ICD-10-CM | POA: Insufficient documentation

## 2017-05-15 LAB — POCT RAPID STREP A: STREPTOCOCCUS, GROUP A SCREEN (DIRECT): NEGATIVE

## 2017-05-15 LAB — POCT INFECTIOUS MONO SCREEN: MONO SCREEN: NEGATIVE

## 2017-05-15 MED ORDER — POLYETHYLENE GLYCOL 3350 17 G PO PACK: 17.0000 g | PACK | Freq: Two times a day (BID) | ORAL | 0 refills | Status: DC

## 2017-05-15 MED ORDER — AMOXICILLIN 875 MG PO TABS: 875.0000 mg | ORAL_TABLET | Freq: Two times a day (BID) | ORAL | 0 refills | Status: DC

## 2017-05-15 MED ORDER — ONDANSETRON 4 MG PO TBDP
ORAL_TABLET | ORAL | Status: AC
  Filled 2017-05-15: qty 1

## 2017-05-15 MED ORDER — ONDANSETRON 4 MG PO TBDP
4.0000 mg | ORAL_TABLET | Freq: Once | ORAL | Status: AC
  Administered 2017-05-15: 4 mg via ORAL

## 2017-05-15 MED ORDER — ONDANSETRON 4 MG PO TBDP: 4.0000 mg | ORAL_TABLET | Freq: Three times a day (TID) | ORAL | 0 refills | Status: DC | PRN

## 2017-05-15 NOTE — ED Provider Notes (Signed)
CSN: [id number]     Arrival date & time 05/15/17  1008 History   First MD Initiated Contact with Patient 05/15/17 1114     Chief Complaint  Patient presents with  . URI   (Consider location/radiation/quality/duration/timing/severity/associated sxs/prior Treatment) Patient c/o URI sx's, headache, chills, nausea, and constipation x 4 days.   The history is provided by the patient.  URI  Presenting symptoms: congestion, fatigue, fever and sore throat   Severity:  Moderate Onset quality:  Sudden Duration:  4 days Timing:  Constant Chronicity:  New Relieved by:  None tried Worsened by:  Nothing Ineffective treatments:  None tried   Past Medical History:  Diagnosis Date  . ADD (attention deficit disorder)    Past Surgical History:  Procedure Laterality Date  . MOUTH SURGERY     Family History  Problem Relation Age of Onset  . Hypertension Father    Social History  Substance Use Topics  . Smoking status: Current Every Day Smoker    Packs/day: 0.20    Types: Cigarettes  . Smokeless tobacco: Never Used  . Alcohol use 2.4 oz/week    4 Cans of beer per week    Review of Systems  Constitutional: Positive for fatigue and fever.  HENT: Positive for congestion and sore throat.   Eyes: Negative.   Respiratory: Negative.   Cardiovascular: Negative.   Gastrointestinal: Positive for constipation and nausea.  Endocrine: Negative.   Genitourinary: Negative.   Musculoskeletal: Negative.   Allergic/Immunologic: Negative.   Neurological: Negative.   Hematological: Negative.   Psychiatric/Behavioral: Negative.     Allergies  Patient has no known allergies.  Home Medications   Prior to Admission medications   Medication Sig Start Date End Date Taking? Authorizing Provider  amoxicillin (AMOXIL) 875 MG tablet Take 1 tablet (875 mg total) by mouth 2 (two) times daily. 05/15/17   Deatra Canter, FNP  ondansetron (ZOFRAN ODT) 4 MG disintegrating tablet Take 1 tablet (4 mg  total) by mouth every 8 (eight) hours as needed for nausea or vomiting. 05/15/17   Deatra Canter, FNP  polyethylene glycol [facility]) packet Take 17 g by mouth 2 (two) times daily. 05/15/17   Deatra Canter, FNP   Meds Ordered and Administered this Visit   Medications  ondansetron (ZOFRAN-ODT) disintegrating tablet 4 mg (4 mg Oral Given 05/15/17 1123)    BP (!) 100/56 (BP Location: Right Arm)   Pulse 73   Temp 98.9 F (37.2 C) (Oral)   Resp 16   SpO2 100%  No data found.   Physical Exam  Constitutional: He appears well-developed and well-nourished.  HENT:  Head: Normocephalic and atraumatic.  Eyes: Conjunctivae and EOM are normal. Pupils are equal, round, and reactive to light.  Neck: Normal range of motion. Neck supple.  Cardiovascular: Normal rate, regular rhythm and normal heart sounds.   Pulmonary/Chest: Effort normal and breath sounds normal.  Abdominal: Soft. Bowel sounds are normal.  Nursing note and vitals reviewed.   Urgent Care Course     Procedures (including critical care time)  Labs Review Labs Reviewed  POCT RAPID STREP A  POCT INFECTIOUS MONO SCREEN    Imaging Review No results found.   Visual Acuity Review  Right Eye Distance:   Left Eye Distance:   Bilateral Distance:    Right Eye Near:   Left Eye Near:    Bilateral Near:         MDM   1. Acute pharyngitis, unspecified etiology   2.  Constipation, unspecified constipation type    Amoxicillin 875mg  po bid x10 days  Zofran ODT 4mg  now Zofran ODT 4mg  one po tid prn#20  Miralax 17 gram po bid        Deatra Canter[place], Karan Inclan J, FNP 05/15/17 1424

## 2017-05-15 NOTE — ED Triage Notes (Signed)
Pt here for cold sx onset 4 days associated w/HA, chills,   Also reports feeling nauseas, vomiting, constipation x4 days  Denies fevers  Taking ibup w/temp relief.   A&O x4... NAD... Ambulatory

## 2017-05-15 NOTE — Progress Notes (Addendum)
Subjective:     Austin [name] is a 26 y.o. male who presents for evaluation of abdominal pain. Onset was 3 days ago. Symptoms have been gradually worsening. The pain is described as patient describes as "empty and aching"., and is patient unable to describe./10 in intensity. Pain is located in the generalized abdominal pain without radiation.  Aggravating factors: none.  Alleviating factors: none. Associated symptoms: fatigue, headache, nausea, vomiting x 1 episode, anorexia, fever,constipation, and feeling like he has the flu. Patient states when symptoms started, "I felt like death".  Patient appeared pale and unable to sit up on the examination table. The patient denies diarrhea, hematuria and melena.  The patient's history has been marked as reviewed and updated as appropriate. Past Medical History:  Diagnosis Date  . ADD (attention deficit disorder)    No current outpatient prescriptions on file.   No current facility-administered medications for this visit.    Allergies: Patient has no known allergies.  Review of Systems not reviewed.  Based on patient's HPI, [facility]     Objective:    No exam performed today, patient's symptoms needed higher level of care..    Assessment:    Abdominal pain  Plan:    [facility] for higher level of care.

## 2017-05-17 LAB — CULTURE, GROUP A STREP (THRC)

## 2017-05-26 ENCOUNTER — Telehealth (HOSPITAL_COMMUNITY): Payer: Self-pay | Admitting: Emergency Medicine

## 2017-05-26 NOTE — Telephone Encounter (Signed)
Returned pt's call who was needing lab results....   Called and LM on [phone] Need to give lab results and to see how pt is doing from recent visit Notified pt in gen message of results and that there is NO need to call back unless not feeling any better, not tolerating meds well or if wanting to know lab results. Also let pt know labs can be obtained from MyChart

## 2017-11-02 ENCOUNTER — Encounter: Payer: Self-pay | Admitting: Internal Medicine

## 2017-11-17 HISTORY — PX: HERNIA REPAIR: SHX51

## 2017-12-08 NOTE — Progress Notes (Signed)
OV to reestablish care   Assessment and Plan: Austin [name] was seen today for establish care.  Diagnoses and all orders for this visit:  Encounter to establish care  Medication management Unable to provide urine specimen; cannot draw blood due to severe anxiety Will obtain at follow up CPE in 6 months with new anxiety medications and 1 time dose of benzodiazepine -     Cancel: BASIC METABOLIC PANEL WITH GFR -     Cancel: Hepatic function panel -     Cancel: CBC with Differential/Platelet -     Cancel: Urinalysis w microscopic + reflex cultur  Attention deficit disorder, unspecified hyperactivity presence -     buPROPion (WELLBUTRIN XL) 150 MG 24 hr tablet; Take 1 tablet (150 mg total) by mouth daily. Start with 1 tab daily; may increase to 2 tabs daily after 1 week.  Right inguinal hernia -     Ambulatory referral to General Surgery  Anxiety Start new medication as prescribed Stress management techniques discussed, increase water, good sleep hygiene discussed, increase exercise, and increase veggies.  Follow up 6 month, call the office if any new AE's from medications and we will switch them -     busPIRone (BUSPAR) 10 MG tablet; Take 1/2-1 tab up to three times daily as needed for anxiety  Discussed med's effects and SE's. Screening labs and tests as requested with regular follow-up as recommended. Over 30 minutes of exam, counseling, chart review and critical decision making was performed  Future Appointments  Date Time Provider Department Center  05/31/2018  9:00 AM Judd Gaudierorbett, Friend Dorfman, NP [facility] None    HPI  This very nice 26 y.o.male presents accompanied by his girlfriend/significant other for to reestablish care after a 3 year absence.  Patient has ADD on his problem list with no other reported health issues. He was previously treated with adderall but reports SE with medication and would prefer to avoid amphetamines. He has not tried wellbutrin previously. He also reports ongoing  anxiety which has never been formally diagnosed. He denies panic attacks other than with venipuncture. The patient presents concerned with new ?inquinal hernia - R groin. Notes bulging with pain with lifting at work - resolves with sitting/resting at the end of the day. No changes in bowel movements/patterns, no nausea/vomiting. He denies "pain" but reports tenderness towards the end of the day.   BMI is Body mass index is 16.46 kg/m., he has not been working on diet and exercise. Wt Readings from Last 3 Encounters:  12/09/17 118 lb (53.5 kg)  05/15/17 120 lb (54.4 kg)  09/01/14 123 lb (55.8 kg)     Current Medications:  Current Outpatient Medications on File Prior to Visit  Medication Sig Dispense Refill  . ondansetron (ZOFRAN ODT) 4 MG disintegrating tablet Take 1 tablet (4 mg total) by mouth every 8 (eight) hours as needed for nausea or vomiting. 20 tablet 0  . polyethylene glycol (MIRALAX) packet Take 17 g by mouth 2 (two) times daily. (Patient taking differently: Take 17 g by mouth as needed. ) 24 each 0  . amoxicillin (AMOXIL) 875 MG tablet Take 1 tablet (875 mg total) by mouth 2 (two) times daily. 20 tablet 0   No current facility-administered medications on file prior to visit.    Health Maintenance:    There is no immunization history on file for this patient.  Sexually Active: yes STD testing offered - declines  Last Eye Exam: 2018  Allergies: No Known Allergies Medical History:  has  ADD (attention deficit disorder); Anxiety; and Right inguinal hernia on their problem list. Surgical History:  He  has a past surgical history that includes Mouth surgery. Family History:  Hisfamily history includes Hypertension in his father; Lung cancer in his paternal grandmother. Social History:   reports that he has been smoking e-cigarettes.  He has been smoking about 0.00 packs per day. he has never used smokeless tobacco. He reports that he does not drink alcohol or use  drugs.  Review of Systems: Review of Systems  Constitutional: Negative for malaise/fatigue and weight loss.  HENT: Negative for hearing loss and tinnitus.   Eyes: Negative for blurred vision and double vision.  Respiratory: Negative for cough, shortness of breath and wheezing.   Cardiovascular: Negative for chest pain, palpitations, orthopnea, claudication and leg swelling.  Gastrointestinal: Positive for abdominal pain (mild tenderness/discomfort intermittently). Negative for blood in stool, constipation, diarrhea, heartburn, melena, nausea and vomiting.  Genitourinary: Negative.   Musculoskeletal: Negative for joint pain and myalgias.  Skin: Negative for rash.  Neurological: Negative for dizziness, tingling, sensory change, weakness and headaches.  Endo/Heme/Allergies: Negative for polydipsia.  Psychiatric/Behavioral: Negative.   All other systems reviewed and are negative.   Physical Exam: Estimated body mass index is 16.46 kg/m as calculated from the following:   Height as of 09/01/14: 5\' 11"  (1.803 m).   Weight as of this encounter: 118 lb (53.5 kg). BP 122/80   Pulse 100   Temp (!) 97.5 F (36.4 C)   Wt 118 lb (53.5 kg)   SpO2 98%   BMI 16.46 kg/m  General Appearance: Well nourished, in no apparent distress.  Eyes: PERRLA, EOMs, conjunctiva no swelling or erythema, normal fundi and vessels.  Sinuses: No Frontal/maxillary tenderness  ENT/Mouth: Ext aud canals clear, normal light reflex with TMs without erythema, bulging. Good dentition. No erythema, swelling, or exudate on post pharynx. Tonsils not swollen or erythematous. Hearing normal.  Neck: Supple, thyroid normal. No bruits  Respiratory: Respiratory effort normal, BS equal bilaterally without rales, rhonchi, wheezing or stridor.  Cardio: RRR without murmurs, rubs or gallops. Brisk peripheral pulses without edema.  Chest: symmetric, with normal excursions and percussion.  Abdomen: Soft, nontender, no guarding,  rebound, masses, or organomegaly. R inguinal hernia present after bearing down, soft, but does not fully retract with palpation.  Lymphatics: Non tender without lymphadenopathy.  Genitourinary: defer Musculoskeletal: Full ROM all peripheral extremities,5/5 strength, and normal gait.  Skin: Warm, dry without rashes, lesions, ecchymosis. Neuro: Cranial nerves intact, reflexes equal bilaterally. Normal muscle tone, no cerebellar symptoms. Sensation intact.  Psych: Awake and oriented X 3, normal affect, Insight and Judgment appropriate.   EKG: defer  [name] 1:51 PM [facility]

## 2017-12-09 ENCOUNTER — Encounter: Payer: Self-pay | Admitting: Adult Health

## 2017-12-09 ENCOUNTER — Ambulatory Visit (INDEPENDENT_AMBULATORY_CARE_PROVIDER_SITE_OTHER): Payer: Commercial Managed Care - PPO | Admitting: Adult Health

## 2017-12-09 VITALS — BP 122/80 | HR 100 | Temp 97.5°F | Wt 118.0 lb

## 2017-12-09 DIAGNOSIS — F988 Other specified behavioral and emotional disorders with onset usually occurring in childhood and adolescence: Secondary | ICD-10-CM | POA: Diagnosis not present

## 2017-12-09 DIAGNOSIS — Z7689 Persons encountering health services in other specified circumstances: Secondary | ICD-10-CM

## 2017-12-09 DIAGNOSIS — F419 Anxiety disorder, unspecified: Secondary | ICD-10-CM | POA: Diagnosis not present

## 2017-12-09 DIAGNOSIS — K4090 Unilateral inguinal hernia, without obstruction or gangrene, not specified as recurrent: Secondary | ICD-10-CM

## 2017-12-09 DIAGNOSIS — Z79899 Other long term (current) drug therapy: Secondary | ICD-10-CM | POA: Diagnosis not present

## 2017-12-09 HISTORY — DX: Unilateral inguinal hernia, without obstruction or gangrene, not specified as recurrent: K40.90

## 2017-12-09 MED ORDER — BUSPIRONE HCL 10 MG PO TABS: ORAL_TABLET | ORAL | 1 refills | Status: DC

## 2017-12-09 MED ORDER — BUPROPION HCL ER (XL) 150 MG PO TB24: 150.0000 mg | ORAL_TABLET | Freq: Every day | ORAL | 2 refills | Status: DC

## 2017-12-09 NOTE — Patient Instructions (Addendum)
Start wellbutrin 150 mg daily - can increase to 2 tabs (300 mg) in 1 week.    Wait to start buspar- start slow, increase slowly, take as needed.       Inguinal Hernia, Adult An inguinal hernia is when fat or the intestines push through the area where the leg meets the lower abdomen (groin) and create a rounded lump (bulge). This condition develops over time. There are three types of inguinal hernias. These types include:  Hernias that can be pushed back into the belly (are reducible).  Hernias that are not reducible (are incarcerated).  Hernias that are not reducible and lose their blood supply (are strangulated). This type of hernia requires emergency surgery.  What are the causes? This condition is caused by having a weak spot in the muscles or tissue. This weakness lets the hernia poke through. This condition can be triggered by:  Suddenly straining the muscles of the lower abdomen.  Lifting heavy objects.  Straining to have a bowel movement. Difficult bowel movements (constipation) can lead to this.  Coughing.  What increases the risk? This condition is more likely to develop in:  Men.  Pregnant women.  People who: ? Are overweight. ? Work in jobs that require long periods of standing or heavy lifting. ? Have had an inguinal hernia before. ? Smoke or have lung disease. These factors can lead to long-lasting (chronic) coughing.  What are the signs or symptoms? Symptoms can depend on the size of the hernia. Often, a small inguinal hernia has no symptoms. Symptoms of a larger hernia include:  A lump in the groin. This is easier to see when the person is standing. It might not be visible when he or she is lying down.  Pain or burning in the groin. This occurs especially when lifting, straining, or coughing.  A dull ache or a feeling of pressure in the groin.  A lump in the scrotum in men.  Symptoms of a strangulated inguinal hernia can include:  A bulge in the  groin that is very painful and tender to the touch.  A bulge that turns red or purple.  Fever, nausea, and vomiting.  The inability to have a bowel movement or to pass gas.  How is this diagnosed? This condition is diagnosed with a medical history and physical exam. Your health care provider may feel your groin area and ask you to cough. How is this treated? Treatment for this condition varies depending on the size of your hernia and whether you have symptoms. If you do not have symptoms, your health care provider may have you watch your hernia carefully and come in for follow-up visits. If your hernia is larger or if you have symptoms, your treatment will include surgery. Follow these instructions at home: Lifestyle  Drink enough fluid to keep your urine clear or pale yellow.  Eat a diet that includes a lot of fiber. Eat plenty of fruits, vegetables, and whole grains. Talk with your health care provider if you have questions.  Avoid lifting heavy objects.  Avoid standing for long periods of time.  Do not use tobacco products, including cigarettes, chewing tobacco, or e-cigarettes. If you need help quitting, ask your health care provider.  Maintain a healthy weight. General instructions  Do not try to force the hernia back in.  Watch your hernia for any changes in color or size. Let your health care provider know if any changes occur.  Take over-the-counter and prescription medicines only as  told by your health care provider.  Keep all follow-up visits as told by your health care provider. This is important. Contact a health care provider if:  You have a fever.  You have new symptoms.  Your symptoms get worse. Get help right away if:  You have pain in the groin that suddenly gets worse.  A bulge in the groin gets bigger suddenly and does not go down.  You are a man and you have a sudden pain in the scrotum, or the size of your scrotum suddenly changes.  A bulge in  the groin area becomes red or purple and is painful to the touch.  You have nausea or vomiting that does not go away.  You feel your heart beating a lot more quickly than normal.  You cannot have a bowel movement or pass gas. This information is not intended to replace advice given to you by your health care provider. Make sure you discuss any questions you have with your health care provider. Document Released: 03/22/2009 Document Revised: 04/10/2016 Document Reviewed: 09/13/2014 Elsevier Interactive Patient Education  [employer].    Please be aware that some of the medications that you are on can sometimes cause a rare and potentially dangerous adverse reaction, called SEROTONIN SYNDROME: Symptoms of this condition include (but are not limited to):  Agitation or restlessness, confusion, rapid heart rate and high blood pressure, dilated pupils, loss of muscle coordination or twitching muscles, muscle rigidity/stiffness, sweating and/or flushing, diarrhea, headache, shivering, goose bumps. If you have any of these symptoms you may have to stop the medication. Call your health care provider immediately.  Severe serotonin syndrome can be life-threatening emergency. Signs and symptoms of a severe reaction may include: high fever, seizures, irregular heartbeat, unconsciousness or altered level of awareness or personality changes.  If you have any of these new symptoms, call 911 or have someone take you to the emergency room.

## 2017-12-29 ENCOUNTER — Ambulatory Visit: Payer: Self-pay | Admitting: Surgery

## 2017-12-29 DIAGNOSIS — Z01818 Encounter for other preprocedural examination: Secondary | ICD-10-CM | POA: Diagnosis not present

## 2017-12-29 DIAGNOSIS — K4090 Unilateral inguinal hernia, without obstruction or gangrene, not specified as recurrent: Secondary | ICD-10-CM | POA: Diagnosis not present

## 2017-12-29 NOTE — H&P (Signed)
Austin [name] Documented: 12/29/2017 8:49 AM Location: [facility] Patient #: [id number] DOB: October 22, 1991 Single / Language: [place] / Race: White Male  History of Present Illness Austin Sportsman MD; 12/29/2017 9:18 AM) The patient is a 27 year old male who presents with an inguinal hernia. Note for "Inguinal hernia": ` ` ` Patient sent for surgical consultation at the request of Austin Gaudier, NP  Chief Complaint: Right groin bulging. Probable inguinal hernia.  The patient is a thin active male. Former smoker. Quit a year ago. Over the past few months, he is noted some occasional right groin swelling. No sharp pains that occasionally can be sore and "angry" when he is more active and straining. Not any issue on the left side that he is aware of. Because of intermittent bulging, he discussed with primary care. Right inguinal hernia suspected. Surgical consultation requested.  He's never had any surgeries. Used to smoke one half packs per day but has been tobacco free for a year. Usually moves his bowels every day. He can walk a few miles without difficulty. Moderately active. No history of skin infections or other abnormalities. No problems with urination. No cardiopulmonary issues.  (Review of systems as stated in this history (HPI) or in the review of systems. Otherwise all other 12 point ROS are negative)   Past Surgical History (Austin [name], [place]; 12/29/2017 8:49 AM) Oral Surgery  Diagnostic Studies History (Austin [name], [place]; 12/29/2017 8:49 AM) Colonoscopy never  Allergies (Austin [name], CMA; 12/29/2017 8:51 AM) No Known Drug Allergies [12/29/2017]:  Medication History (Austin [name], CMA; 12/29/2017 8:51 AM) No Current Medications Medications Reconciled  Social History (Austin [name], CMA; 12/29/2017 8:49 AM) Alcohol use Recently quit alcohol use. Caffeine use Carbonated beverages, Tea. Illicit drug use Prefer to discuss with provider. Tobacco  use Former smoker.  Family History (Austin [name], [place]; 12/29/2017 8:49 AM) Hypertension Father.  Other Problems (Austin [name], CMA; 12/29/2017 8:49 AM) Anxiety Disorder     Review of Systems (Austin [name] CMA; 12/29/2017 8:50 AM) General Not Present- Appetite Loss, Chills, Fatigue, Fever, Night Sweats, Weight Gain and Weight Loss. Skin Not Present- Change in Wart/Mole, Dryness, Hives, Jaundice, New Lesions, Non-Healing Wounds, Rash and Ulcer. HEENT Not Present- Earache, Hearing Loss, Hoarseness, Nose Bleed, Oral Ulcers, Ringing in the Ears, Seasonal Allergies, Sinus Pain, Sore Throat, Visual Disturbances, Wears glasses/contact lenses and Yellow Eyes. Respiratory Not Present- Bloody sputum, Chronic Cough, Difficulty Breathing, Snoring and Wheezing. Breast Not Present- Breast Mass, Breast Pain, Nipple Discharge and Skin Changes. Cardiovascular Not Present- Chest Pain, Difficulty Breathing Lying Down, Leg Cramps, Palpitations, Rapid Heart Rate, Shortness of Breath and Swelling of Extremities. Gastrointestinal Present- Constipation. Not Present- Abdominal Pain, Bloating, Bloody Stool, Change in Bowel Habits, Chronic diarrhea, Difficulty Swallowing, Excessive gas, Gets full quickly at meals, Hemorrhoids, Indigestion, Nausea, Rectal Pain and Vomiting. Male Genitourinary Not Present- Blood in Urine, Change in Urinary Stream, Frequency, Impotence, Nocturia, Painful Urination, Urgency and Urine Leakage. Musculoskeletal Not Present- Back Pain, Joint Pain, Joint Stiffness, Muscle Pain, Muscle Weakness and Swelling of Extremities. Neurological Not Present- Decreased Memory, Fainting, Headaches, Numbness, Seizures, Tingling, Tremor, Trouble walking and Weakness. Psychiatric Present- Anxiety. Not Present- Bipolar, Change in Sleep Pattern, Depression, Fearful and Frequent crying. Endocrine Not Present- Cold Intolerance, Excessive Hunger, Hair Changes, Heat Intolerance, Hot flashes and New  Diabetes. Hematology Not Present- Blood Thinners, Easy Bruising, Excessive bleeding, Gland problems, HIV and Persistent Infections.  Vitals (Austin [name] CMA; 12/29/2017 8:51 AM) 12/29/2017 8:51 AM Weight: 119 lb Height: 71in Body  Surface Area: 1.69 m Body Mass Index: 16.6 kg/m  Temp.: 97.74F(Oral)  Pulse: 94 (Regular)  BP: 118/78 (Sitting, Left Arm, Standard)      Physical Exam Austin Sportsman MD; 12/29/2017 9:12 AM)  General Mental Status-Alert. General Appearance-Not in acute distress, Not Sickly. Orientation-Oriented X3. Hydration-Well hydrated. Voice-Normal.  Integumentary Global Assessment Upon inspection and palpation of skin surfaces of the - Axillae: non-tender, no inflammation or ulceration, no drainage. and Distribution of scalp and body hair is normal. General Characteristics Temperature - normal warmth is noted.  Head and Neck Head-normocephalic, atraumatic with no lesions or palpable masses. Face Global Assessment - atraumatic, no absence of expression. Neck Global Assessment - no abnormal movements, no bruit auscultated on the right, no bruit auscultated on the left, no decreased range of motion, non-tender. Trachea-midline. Thyroid Gland Characteristics - non-tender.  Eye Eyeball - Left-Extraocular movements intact, No Nystagmus. Eyeball - Right-Extraocular movements intact, No Nystagmus. Cornea - Left-No Hazy. Cornea - Right-No Hazy. Sclera/Conjunctiva - Left-No scleral icterus, No Discharge. Sclera/Conjunctiva - Right-No scleral icterus, No Discharge. Pupil - Left-Direct reaction to light normal. Pupil - Right-Direct reaction to light normal.  ENMT Ears Pinna - Left - no drainage observed, no generalized tenderness observed. Right - no drainage observed, no generalized tenderness observed. Nose and Sinuses External Inspection of the Nose - no destructive lesion observed. Inspection of the nares - Left -  quiet respiration. Right - quiet respiration. Mouth and Throat Lips - Upper Lip - no fissures observed, no pallor noted. Lower Lip - no fissures observed, no pallor noted. Nasopharynx - no discharge present. Oral Cavity/Oropharynx - Tongue - no dryness observed. Oral Mucosa - no cyanosis observed. Hypopharynx - no evidence of airway distress observed.  Chest and Lung Exam Inspection Movements - Normal and Symmetrical. Accessory muscles - No use of accessory muscles in breathing. Palpation Palpation of the chest reveals - Non-tender. Auscultation Breath sounds - Normal and Clear.  Cardiovascular Auscultation Rhythm - Regular. Murmurs & Other Heart Sounds - Auscultation of the heart reveals - No Murmurs and No Systolic Clicks.  Abdomen Inspection Inspection of the abdomen reveals - No Visible peristalsis and No Abnormal pulsations. Umbilicus - No Bleeding, No Urine drainage. Palpation/Percussion Palpation and Percussion of the abdomen reveal - Soft, Non Tender, No Rebound tenderness, No Rigidity (guarding) and No Cutaneous hyperesthesia. Note: Abdomen soft. Nontender. Not distended. No umbilical or incisional hernias. No guarding.  Male Genitourinary Sexual Maturity Tanner 5 - Adult hair pattern and Adult penile size and shape. Note: Sensitive right inner groin with small bulging worse with cough. Possible right femoral hernia versus low direct inguinal hernia.  Sensitivity at left as well without obvious bulging. Testes epididymides and penis otherwise normal  Peripheral Vascular Upper Extremity Inspection - Left - No Cyanotic nailbeds, Not Ischemic. Right - No Cyanotic nailbeds, Not Ischemic.  Neurologic Neurologic evaluation reveals -normal attention span and ability to concentrate, able to name objects and repeat phrases. Appropriate fund of knowledge , normal sensation and normal coordination. Mental Status Affect - not angry, not paranoid. Cranial Nerves-Normal  Bilaterally. Gait-Normal.  Neuropsychiatric Mental status exam performed with findings of-able to articulate well with normal speech/language, rate, volume and coherence, thought content normal with ability to perform basic computations and apply abstract reasoning and no evidence of hallucinations, delusions, obsessions or homicidal/suicidal ideation.  Musculoskeletal Global Assessment Spine, Ribs and Pelvis - no instability, subluxation or laxity. Right Upper Extremity - no instability, subluxation or laxity.  Lymphatic Head & Neck  General Head & Neck Lymphatics: Bilateral - Description - No Localized lymphadenopathy. Axillary  General Axillary Region: Bilateral - Description - No Localized lymphadenopathy. Femoral & Inguinal  Generalized Femoral & Inguinal Lymphatics: Left - Description - No Localized lymphadenopathy. Right - Description - No Localized lymphadenopathy.    Assessment & Plan Austin Sportsman MD; 12/29/2017 9:14 AM)  RIGHT INGUINAL HERNIA (K40.90) Impression: Sensitivity at soreness in right groin with bulging consistent with right inguinal versus femoral hernia. Less sensitive on the contralateral side with no obvious left inguinal hernia but mildly suspicious in this very thin active male   PREOP - ING HERNIA - ENCOUNTER FOR PREOPERATIVE EXAMINATION FOR GENERAL SURGICAL PROCEDURE (Z01.818)  Current Plans You are being scheduled for surgery- Our schedulers will call you.  You should hear from our office's scheduling department within 5 working days about the location, date, and time of surgery. We try to make accommodations for patient's preferences in scheduling surgery, but sometimes the OR schedule or the surgeon's schedule prevents [place] from making those accommodations.  If you have not heard from our office [phone]) in 5 working days, call the office and ask for your surgeon's nurse.  If you have other questions about your diagnosis, plan, or  surgery, call the office and ask for your surgeon's nurse.  Written instructions provided The anatomy & physiology of the abdominal wall and pelvic floor was discussed. The pathophysiology of hernias in the inguinal and pelvic region was discussed. Natural history risks such as progressive enlargement, pain, incarceration, and strangulation was discussed. Contributors to complications such as smoking, obesity, diabetes, prior surgery, etc were discussed.  I feel the risks of no intervention will lead to serious problems that outweigh the operative risks; therefore, I recommended surgery to reduce and repair the hernia. I explained laparoscopic techniques with possible need for an open approach. I noted usual use of mesh to patch and/or buttress hernia repair  Risks such as bleeding, infection, abscess, need for further treatment, heart attack, death, and other risks were discussed. I noted a good likelihood this will help address the problem. Goals of post-operative recovery were discussed as well. Possibility that this will not correct all symptoms was explained. I stressed the importance of low-impact activity, aggressive pain control, avoiding constipation, & not pushing through pain to minimize risk of post-operative chronic pain or injury. Possibility of reherniation was discussed. We will work to minimize complications.  An educational handout further explaining the pathology & treatment options was given as well. Questions were answered. The patient expresses understanding & wishes to proceed with surgery.  Pt Education - Pamphlet Given - Laparoscopic Hernia Repair: discussed with patient and provided information. Pt Education - [facility] Pain Control (Milanni Ayub) Pt Education - [facility] Hernia Post-Op HCI (Amandeep Hogston): discussed with patient and provided information.  Austin [name], M.D., F.A.C.S. Gastrointestinal and Minimally Invasive Surgery [facility] [address] [place], [place] [address] [phone] Main / Paging

## 2018-01-12 ENCOUNTER — Other Ambulatory Visit: Payer: Self-pay | Admitting: Adult Health

## 2018-01-12 DIAGNOSIS — F410 Panic disorder [episodic paroxysmal anxiety] without agoraphobia: Secondary | ICD-10-CM

## 2018-01-12 MED ORDER — ALPRAZOLAM 1 MG PO TABS: ORAL_TABLET | ORAL | 0 refills | Status: DC

## 2018-03-09 DIAGNOSIS — J209 Acute bronchitis, unspecified: Secondary | ICD-10-CM | POA: Diagnosis not present

## 2018-03-09 DIAGNOSIS — Z9109 Other allergy status, other than to drugs and biological substances: Secondary | ICD-10-CM | POA: Diagnosis not present

## 2018-03-09 DIAGNOSIS — R091 Pleurisy: Secondary | ICD-10-CM | POA: Diagnosis not present

## 2018-03-30 DIAGNOSIS — Z79899 Other long term (current) drug therapy: Secondary | ICD-10-CM | POA: Diagnosis not present

## 2018-05-30 DIAGNOSIS — Z681 Body mass index (BMI) 19 or less, adult: Secondary | ICD-10-CM | POA: Insufficient documentation

## 2018-05-30 NOTE — Progress Notes (Deleted)
Complete Physical  Assessment and Plan: Health Maintenance- Discussed STD testing, safe sex, alcohol and drug awareness, drinking and driving dangers, wearing a seat belt and general safety measures for young adult.  Diagnoses and all orders for this visit:  Encounter for routine adult health examination without abnormal findings  Right inguinal hernia Pending surgery this August  Anxiety ***  Attention deficit disorder, unspecified hyperactivity presence ***  BMI less than 19,adult Continue to recommend diet heavy in fruits and veggies and low in animal meats, cheeses, and dairy products, appropriate calorie intake Discuss exercise recommendations routinely Continue to monitor weight at each visit Defer A1C, lipids  Anemia, unspecified type - CBC, B12, TIBC  Medication management CBC, CMP/GFR, UA  Vitamin D deficiency - vit D  Screening for thyroid disorder - TSH    Discussed med's effects and SE's. Screening labs and tests as requested with regular follow-up as recommended. Over 40 minutes of exam, counseling, chart review and critical decision making was performed  Future Appointments  Date Time Provider Department Center  05/31/2018  9:00 AM [name], NP [facility] None  06/01/2019  9:00 AM [name], NP [facility] None    HPI  This very nice 27 y.o.male presents for complete physical. He has ADD (attention deficit disorder); Anxiety; Right inguinal hernia; and BMI less than 19,adult on their problem list. He is pending hernia repair by Dr. Michaell Cowing this August.  Patient reports no complaints at this time.   he has a diagnosis of anxiety and is currently on ***, reports symptoms are*** well controlled on current regimen. he   Patient is on wellbutrin for mood and ADD ***   BMI is There is no height or weight on file to calculate BMI., he {HAS HAS ZOX:09604} been working on diet and exercise. Wt Readings from Last 3 Encounters:  12/09/17 118 lb  (53.5 kg)  05/15/17 120 lb (54.4 kg)  09/01/14 123 lb (55.8 kg)   Today their BP is   He denies chest pain, shortness of breath, dizziness.   The cholesterol last visit was:   Lab Results  Component Value Date   CHOL 133 07/06/2014   HDL 51 07/06/2014   LDLCALC 57 07/06/2014   TRIG 125 07/06/2014   CHOLHDL 2.6 07/06/2014   Last V4U in the office was:  Lab Results  Component Value Date   HGBA1C 4.9 07/06/2014   Finally, patient has history of Vitamin D Deficiency and last vitamin D was  Lab Results  Component Value Date   VD25OH 47 07/06/2014    Current Medications:  Current Outpatient Medications on File Prior to Visit  Medication Sig Dispense Refill  . ALPRAZolam (XANAX) 1 MG tablet TAKE 1 TAB PRIOR TO BLOOD DRAWS PRIOR TO SURGERY AS NEEDED FOR ANXIETY/PANIC ATTACKS. 5 tablet 0  . amoxicillin (AMOXIL) 875 MG tablet Take 1 tablet (875 mg total) by mouth 2 (two) times daily. 20 tablet 0  . buPROPion (WELLBUTRIN XL) 150 MG 24 hr tablet Take 1 tablet (150 mg total) by mouth daily. Start with 1 tab daily; may increase to 2 tabs daily after 1 week. 90 tablet 2  . busPIRone (BUSPAR) 10 MG tablet Take 1/2-1 tab up to three times daily as needed for anxiety 90 tablet 1  . ondansetron (ZOFRAN ODT) 4 MG disintegrating tablet Take 1 tablet (4 mg total) by mouth every 8 (eight) hours as needed for nausea or vomiting. 20 tablet 0  . polyethylene glycol (MIRALAX) packet Take 17 g by  mouth 2 (two) times daily. (Patient taking differently: Take 17 g by mouth as needed. ) 24 each 0   No current facility-administered medications on file prior to visit.    Health Maintenance:    There is no immunization history on file for this patient.  TD/TDAP: Influenza: Pneumovax:  n/a HPV vaccines:  Sexually Active: {YES NO:22349} STD testing offered   Last Dental Exam: Last Eye Exam:  Allergies: No Known Allergies Medical History:  has ADD (attention deficit disorder); Anxiety; Right  inguinal hernia; and BMI less than 19,adult on their problem list. Surgical History:  He  has a past surgical history that includes Mouth surgery. Family History:  Hisfamily history includes Hypertension in his father; Lung cancer in his paternal grandmother. Social History:   reports that he has been smoking e-cigarettes.  He has been smoking about 0.00 packs per day. He has never used smokeless tobacco. He reports that he does not drink alcohol or use drugs.  Review of Systems: Review of Systems  Constitutional: Negative for malaise/fatigue and weight loss.  HENT: Negative for hearing loss and tinnitus.   Eyes: Negative for blurred vision and double vision.  Respiratory: Negative for cough, sputum production, shortness of breath and wheezing.   Cardiovascular: Negative for chest pain, palpitations, orthopnea, claudication, leg swelling and PND.  Gastrointestinal: Negative for abdominal pain, blood in stool, constipation, diarrhea, heartburn, melena, nausea and vomiting.  Genitourinary: Negative.   Musculoskeletal: Negative for falls, joint pain and myalgias.  Skin: Negative for rash.  Neurological: Negative for dizziness, tingling, sensory change, weakness and headaches.  Endo/Heme/Allergies: Negative for polydipsia.  Psychiatric/Behavioral: Negative for depression, memory loss, substance abuse and suicidal ideas. The patient is nervous/anxious. The patient does not have insomnia.   All other systems reviewed and are negative.   Physical Exam: Estimated body mass index is 16.46 kg/m as calculated from the following:   Height as of 09/01/14: 5\' 11"  (1.803 m).   Weight as of 12/09/17: 118 lb (53.5 kg). There were no vitals taken for this visit. General Appearance: Well nourished, in no apparent distress.  Eyes: PERRLA, EOMs, conjunctiva no swelling or erythema, normal fundi and vessels.  Sinuses: No Frontal/maxillary tenderness  ENT/Mouth: Ext aud canals clear, normal light reflex  with TMs without erythema, bulging. Good dentition. No erythema, swelling, or exudate on post pharynx. Tonsils not swollen or erythematous. Hearing normal.  Neck: Supple, thyroid normal. No bruits  Respiratory: Respiratory effort normal, BS equal bilaterally without rales, rhonchi, wheezing or stridor.  Cardio: RRR without murmurs, rubs or gallops. Brisk peripheral pulses without edema.  Chest: symmetric, with normal excursions and percussion.  Abdomen: Soft, nontender, no guarding, rebound, hernias, masses, or organomegaly.  Lymphatics: Non tender without lymphadenopathy.  Genitourinary: defer Musculoskeletal: Full ROM all peripheral extremities,5/5 strength, and normal gait.  Skin: Warm, dry without rashes, lesions, ecchymosis. Neuro: Cranial nerves intact, reflexes equal bilaterally. Normal muscle tone, no cerebellar symptoms. Sensation intact.  Psych: Awake and oriented X 3, normal affect, Insight and Judgment appropriate.   EKG: defer  Dan Makershley C Jayren Cease 8:58 AM [facility]

## 2018-05-31 ENCOUNTER — Encounter: Payer: Self-pay | Admitting: Adult Health

## 2018-05-31 DIAGNOSIS — Z0000 Encounter for general adult medical examination without abnormal findings: Secondary | ICD-10-CM

## 2018-06-30 DIAGNOSIS — Z79899 Other long term (current) drug therapy: Secondary | ICD-10-CM | POA: Diagnosis not present

## 2018-07-13 ENCOUNTER — Ambulatory Visit: Admit: 2018-07-13 | Payer: Self-pay | Admitting: Surgery

## 2018-07-13 SURGERY — REPAIR, HERNIA, INGUINAL, LAPAROSCOPIC
Anesthesia: General | Laterality: Right

## 2018-09-28 DIAGNOSIS — Z79899 Other long term (current) drug therapy: Secondary | ICD-10-CM | POA: Diagnosis not present

## 2019-01-10 ENCOUNTER — Ambulatory Visit: Payer: Self-pay | Admitting: Surgery

## 2019-01-10 DIAGNOSIS — K4090 Unilateral inguinal hernia, without obstruction or gangrene, not specified as recurrent: Secondary | ICD-10-CM | POA: Diagnosis not present

## 2019-01-10 NOTE — H&P (Signed)
Silvestre Moment Documented: 01/10/2019 11:39 AM Location: [facility] Patient #: [id number] DOB: 10/29/1991 Single / Language: [place] / Race: White Male  History of Present Illness [name] MD; 01/10/2019 1:40 PM) The patient is a 28 year old male who presents with an inguinal hernia. Note for "Inguinal hernia": ` ` ` Patient sent for surgical consultation at the request of [name], NP  Chief Complaint: Right groin bulging. Probable inguinal hernia.  The patient is a thin active male. I saw him last year for concerns of right groin pain. I suspected a hernia and offered surgery. We try to reschedule with him but ultimately it did not happen. He comes in today to reconsider surgery  He returns noting that there is more obvious bulging in the right groin. Cannot do any heavy lifting. Usually is easily reducible. However is more obvious. Occasionally left groin sensitivity but again nothing too severe there. Former smoker. Occasionally vaped, but has quit. Usually moves his bowels every day. He can walk a few miles without difficulty. Moderately active. No history of skin infections or other abnormalities. No problems with urination. No cardiopulmonary issues.  (Review of systems as stated in this history (HPI) or in the review of systems. Otherwise all other 12 point ROS are negative)   Allergies Maurilio Lovely, CMA; 01/10/2019 11:39 AM) No Known Drug Allergies [12/29/2017]: Allergies Reconciled  Medication History Maurilio Lovely, CMA; 01/10/2019 11:40 AM) Adderall (10MG  Tablet, Oral) Active. No Current Medications Medications Reconciled     Review of Systems [name], MD; 01/10/2019 12:2 PM) General Not Present- Appetite Loss, Chills, Fatigue, Fever, Night Sweats, Weight Gain and Weight Loss. Skin Not Present- Change in Wart/Mole, Dryness, Hives, Jaundice, New Lesions, Non-Healing Wounds, Rash and Ulcer. HEENT Not Present-  Earache, Hearing Loss, Hoarseness, Nose Bleed, Oral Ulcers, Ringing in the Ears, Seasonal Allergies, Sinus Pain, Sore Throat, Visual Disturbances, Wears glasses/contact lenses and Yellow Eyes. Respiratory Not Present- Bloody sputum, Chronic Cough, Difficulty Breathing, Snoring and Wheezing. Breast Not Present- Breast Mass, Breast Pain, Nipple Discharge and Skin Changes. Cardiovascular Not Present- Chest Pain, Difficulty Breathing Lying Down, Leg Cramps, Palpitations, Rapid Heart Rate, Shortness of Breath and Swelling of Extremities. Gastrointestinal Present- Constipation. Not Present- Abdominal Pain, Bloating, Bloody Stool, Change in Bowel Habits, Chronic diarrhea, Difficulty Swallowing, Excessive gas, Gets full quickly at meals, Hemorrhoids, Indigestion, Nausea, Rectal Pain and Vomiting. Male Genitourinary Not Present- Blood in Urine, Change in Urinary Stream, Frequency, Impotence, Nocturia, Painful Urination, Urgency and Urine Leakage. Musculoskeletal Not Present- Back Pain, Joint Pain, Joint Stiffness, Muscle Pain, Muscle Weakness and Swelling of Extremities. Neurological Not Present- Decreased Memory, Fainting, Headaches, Numbness, Seizures, Tingling, Tremor, Trouble walking and Weakness. Psychiatric Present- Anxiety. Not Present- Bipolar, Change in Sleep Pattern, Depression, Fearful and Frequent crying. Endocrine Not Present- Cold Intolerance, Excessive Hunger, Hair Changes, Heat Intolerance and New Diabetes. Hematology Not Present- Blood Thinners, Easy Bruising, Excessive bleeding, Gland problems, HIV and Persistent Infections.  Vitals Maurilio Lovely CMA; 01/10/2019 11:41 AM) 01/10/2019 11:40 AM Weight: 117.13 lb Height: 71in Body Surface Area: 1.68 m Body Mass Index: 16.34 kg/m  Temp.: 99.21F(Oral)  Pulse: 93 (Regular)  BP: 112/74 (Sitting, Left Arm, Standard)      Physical Exam [name] MD; 01/10/2019 1:41 PM)  General Mental Status-Alert. General  Appearance-Not in acute distress, Not Sickly. Orientation-Oriented X3. Hydration-Well hydrated. Voice-Normal.  Integumentary Global Assessment Normal Exam - Axillae: non-tender, no inflammation or ulceration, no drainage. and Distribution of scalp and body hair is normal.  General Characteristics Temperature - normal warmth is noted.  Head and Neck Head-normocephalic, atraumatic with no lesions or palpable masses. Face Global Assessment - atraumatic, no absence of expression. Neck Global Assessment - no abnormal movements, no bruit auscultated on the right, no bruit auscultated on the left, no decreased range of motion, non-tender. Trachea-midline. Thyroid Gland Characteristics - non-tender.  Eye Eyeball - Left-Extraocular movements intact, No Nystagmus. Eyeball - Right-Extraocular movements intact, No Nystagmus. Cornea - Left-No Hazy. Cornea - Right-No Hazy. Sclera/Conjunctiva - Left-No scleral icterus, No Discharge. Sclera/Conjunctiva - Right-No scleral icterus, No Discharge. Pupil - Left-Direct reaction to light normal. Pupil - Right-Direct reaction to light normal.  ENMT Ears Pinna - Left - no drainage observed, no generalized tenderness observed. Right - no drainage observed, no generalized tenderness observed. Nose and Sinuses Nose - no destructive lesion observed. Nares - Left - quiet respiration. Right - quiet respiration. Mouth and Throat Lips - Upper Lip - no fissures observed, no pallor noted. Lower Lip - no fissures observed, no pallor noted. Nasopharynx - no discharge present. Oral Cavity/Oropharynx - Tongue - no dryness observed. Oral Mucosa - no cyanosis observed. Hypopharynx - no evidence of airway distress observed.  Chest and Lung Exam Inspection Movements - Normal and Symmetrical. Accessory muscles - No use of accessory muscles in breathing. Palpation Normal exam - Non-tender. Auscultation Breath sounds - Normal and  Clear.  Cardiovascular Auscultation Rhythm - Regular. Murmurs & Other Heart Sounds - Normal exam - No Murmurs and No Systolic Clicks.  Abdomen Inspection Normal Exam - No Visible peristalsis and No Abnormal pulsations. Umbilicus - No Bleeding, No Urine drainage. Palpation/Percussion Normal exam - Soft, Non Tender, No Rebound tenderness, No Rigidity (guarding) and No Cutaneous hyperesthesia. Note: Abdomen soft. Nontender. Not distended. No umbilical or incisional hernias. No guarding.  Male Genitourinary Sexual Maturity Tanner 5 - Adult hair pattern and Adult penile size and shape. Note: Sensitive right lateral groin with obvious bulging with coughing or Valsalva. Both inguinal floors bulging with Valsalva suspicious for possible direct space hernias.   Left groin with some mild but sensitive bulging. Not as obvious.    Testes. Epididymidi, and penis otherwise normal  Peripheral Vascular Upper Extremity Inspection - Left - No Cyanotic nailbeds, Not Ischemic. Right - No Cyanotic nailbeds, Not Ischemic.  Neurologic Neurologic evaluation reveals -normal attention span and ability to concentrate, able to name objects and repeat phrases. Appropriate fund of knowledge , normal sensation and normal coordination. Mental Status Affect - not angry, not paranoid. Cranial Nerves-Normal Bilaterally. Gait-Normal.  Neuropsychiatric Mental status exam performed with findings of-able to articulate well with normal speech/language, rate, volume and coherence, thought content normal with ability to perform basic computations and apply abstract reasoning and no evidence of hallucinations, delusions, obsessions or homicidal/suicidal ideation.  Musculoskeletal Global Assessment Spine, Ribs and Pelvis - no instability, subluxation or laxity. Right Upper Extremity - no instability, subluxation or laxity.  Lymphatic Head & Neck General Head & Neck Lymphatics: Bilateral -  Description - No Localized lymphadenopathy. Axillary General Axillary Region: Bilateral - Description - No Localized lymphadenopathy. Femoral & Inguinal Generalized Femoral & Inguinal Lymphatics: Left: Right - Description - No Localized lymphadenopathy. Description - No Localized lymphadenopathy.    Assessment & Plan [name] MD; 01/10/2019 1:38 PM)  RIGHT INGUINAL HERNIA (K40.90) Impression: More obvious bulging with standing and cough. History much more suggestive as well. Consistent with right inguinal hernia. More sensitive in the left groin the last year with some bulging, suspicious for  contralateral hernia as well.  He is ready to consider hernia surgery since its become more bothersome and more obvious. Likes the idea of checking the contralateral side to make sure there is no hernia there.  I again went over the surgery details of laparoscopic approach. I did caution and and very thin people they can struggle with soreness, especially if he's been symptomatic. Hopefully can use thin Ultrapro sheet(s) and avoid risk of hematoma/seroma/chronic groin pain.   PREOP - ING HERNIA - ENCOUNTER FOR PREOPERATIVE EXAMINATION FOR GENERAL SURGICAL PROCEDURE (Z01.818)  Current Plans You are being scheduled for surgery- Our schedulers will call you.  You should hear from our office's scheduling department within 5 working days about the location, date, and time of surgery. We try to make accommodations for patient's preferences in scheduling surgery, but sometimes the OR schedule or the surgeon's schedule prevents [place] from making those accommodations.  If you have not heard from our office [phone]) in 5 working days, call the office and ask for your surgeon's nurse.  If you have other questions about your diagnosis, plan, or surgery, call the office and ask for your surgeon's nurse.  Written instructions provided The anatomy & physiology of the abdominal wall and pelvic floor  was discussed. The pathophysiology of hernias in the inguinal and pelvic region was discussed. Natural history risks such as progressive enlargement, pain, incarceration, and strangulation was discussed. Contributors to complications such as smoking, obesity, diabetes, prior surgery, etc were discussed.  I feel the risks of no intervention will lead to serious problems that outweigh the operative risks; therefore, I recommended surgery to reduce and repair the hernia. I explained laparoscopic techniques with possible need for an open approach. I noted usual use of mesh to patch and/or buttress hernia repair  Risks such as bleeding, infection, abscess, need for further treatment, heart attack, death, and other risks were discussed. I noted a good likelihood this will help address the problem. Goals of post-operative recovery were discussed as well. Possibility that this will not correct all symptoms was explained. I stressed the importance of low-impact activity, aggressive pain control, avoiding constipation, & not pushing through pain to minimize risk of post-operative chronic pain or injury. Possibility of reherniation was discussed. We will work to minimize complications.  An educational handout further explaining the pathology & treatment options was given as well. Questions were answered. The patient expresses understanding & wishes to proceed with surgery.  Pt Education - Pamphlet Given - Laparoscopic Hernia Repair: discussed with patient and provided information. Pt Education - [facility] Pain Control (Shaleka Brines) Pt Education - [facility] Hernia Post-Op HCI (Trevino Wyatt): discussed with patient and provided information.

## 2019-02-08 DIAGNOSIS — Z79899 Other long term (current) drug therapy: Secondary | ICD-10-CM | POA: Diagnosis not present

## 2019-06-01 ENCOUNTER — Encounter: Payer: Self-pay | Admitting: Adult Health

## 2019-06-07 ENCOUNTER — Encounter (HOSPITAL_COMMUNITY): Payer: Self-pay | Admitting: *Deleted

## 2019-06-07 ENCOUNTER — Inpatient Hospital Stay (HOSPITAL_COMMUNITY)
Admission: EM | Admit: 2019-06-07 | Discharge: 2019-06-18 | DRG: 329 | Disposition: A | Discharge status: Home / Self Care | Payer: Commercial Managed Care - PPO | Attending: Surgery | Admitting: Surgery

## 2019-06-07 ENCOUNTER — Emergency Department (HOSPITAL_COMMUNITY): Payer: Commercial Managed Care - PPO

## 2019-06-07 ENCOUNTER — Inpatient Hospital Stay (HOSPITAL_COMMUNITY): Payer: Commercial Managed Care - PPO | Admitting: Certified Registered Nurse Anesthetist

## 2019-06-07 ENCOUNTER — Other Ambulatory Visit: Payer: Self-pay

## 2019-06-07 ENCOUNTER — Encounter (HOSPITAL_COMMUNITY): Admission: EM | Disposition: A | Discharge status: Home / Self Care | Payer: Self-pay | Source: Home / Self Care

## 2019-06-07 DIAGNOSIS — F17290 Nicotine dependence, other tobacco product, uncomplicated: Secondary | ICD-10-CM | POA: Diagnosis present

## 2019-06-07 DIAGNOSIS — E43 Unspecified severe protein-calorie malnutrition: Secondary | ICD-10-CM | POA: Diagnosis present

## 2019-06-07 DIAGNOSIS — R109 Unspecified abdominal pain: Secondary | ICD-10-CM

## 2019-06-07 DIAGNOSIS — R1110 Vomiting, unspecified: Secondary | ICD-10-CM

## 2019-06-07 DIAGNOSIS — Z20828 Contact with and (suspected) exposure to other viral communicable diseases: Secondary | ICD-10-CM | POA: Diagnosis present

## 2019-06-07 DIAGNOSIS — K562 Volvulus: Secondary | ICD-10-CM | POA: Diagnosis not present

## 2019-06-07 DIAGNOSIS — D62 Acute posthemorrhagic anemia: Secondary | ICD-10-CM | POA: Diagnosis not present

## 2019-06-07 DIAGNOSIS — Z801 Family history of malignant neoplasm of trachea, bronchus and lung: Secondary | ICD-10-CM | POA: Diagnosis not present

## 2019-06-07 DIAGNOSIS — K559 Vascular disorder of intestine, unspecified: Secondary | ICD-10-CM | POA: Diagnosis present

## 2019-06-07 DIAGNOSIS — K219 Gastro-esophageal reflux disease without esophagitis: Secondary | ICD-10-CM | POA: Diagnosis present

## 2019-06-07 DIAGNOSIS — Z4659 Encounter for fitting and adjustment of other gastrointestinal appliance and device: Secondary | ICD-10-CM

## 2019-06-07 DIAGNOSIS — F909 Attention-deficit hyperactivity disorder, unspecified type: Secondary | ICD-10-CM | POA: Diagnosis present

## 2019-06-07 DIAGNOSIS — F988 Other specified behavioral and emotional disorders with onset usually occurring in childhood and adolescence: Secondary | ICD-10-CM | POA: Diagnosis present

## 2019-06-07 DIAGNOSIS — Z681 Body mass index (BMI) 19 or less, adult: Secondary | ICD-10-CM | POA: Diagnosis not present

## 2019-06-07 DIAGNOSIS — Z8249 Family history of ischemic heart disease and other diseases of the circulatory system: Secondary | ICD-10-CM | POA: Diagnosis not present

## 2019-06-07 DIAGNOSIS — G4700 Insomnia, unspecified: Secondary | ICD-10-CM | POA: Diagnosis not present

## 2019-06-07 DIAGNOSIS — E876 Hypokalemia: Secondary | ICD-10-CM | POA: Diagnosis present

## 2019-06-07 DIAGNOSIS — K659 Peritonitis, unspecified: Secondary | ICD-10-CM | POA: Diagnosis present

## 2019-06-07 DIAGNOSIS — K567 Ileus, unspecified: Secondary | ICD-10-CM | POA: Diagnosis not present

## 2019-06-07 DIAGNOSIS — R112 Nausea with vomiting, unspecified: Secondary | ICD-10-CM

## 2019-06-07 HISTORY — PX: PARTIAL COLECTOMY: SHX5273

## 2019-06-07 HISTORY — DX: Volvulus: K56.2

## 2019-06-07 HISTORY — PX: LAPAROTOMY: SHX154

## 2019-06-07 LAB — URINALYSIS, ROUTINE W REFLEX MICROSCOPIC
Bilirubin Urine: NEGATIVE
Glucose, UA: NEGATIVE mg/dL
Hgb urine dipstick: NEGATIVE
Ketones, ur: NEGATIVE mg/dL
Leukocytes,Ua: NEGATIVE
Nitrite: NEGATIVE
Protein, ur: NEGATIVE mg/dL
Specific Gravity, Urine: 1.012 (ref 1.005–1.030)
pH: 6 (ref 5.0–8.0)

## 2019-06-07 LAB — POCT I-STAT EG7
Acid-base deficit: 1 mmol/L (ref 0.0–2.0)
Bicarbonate: 26.4 mmol/L (ref 20.0–28.0)
Calcium, Ion: 1.23 mmol/L (ref 1.15–1.40)
HCT: 38 % — ABNORMAL LOW (ref 39.0–52.0)
Hemoglobin: 12.9 g/dL — ABNORMAL LOW (ref 13.0–17.0)
O2 Saturation: 82 %
Patient temperature: 37.7
Potassium: 4.5 mmol/L (ref 3.5–5.1)
Sodium: 136 mmol/L (ref 135–145)
TCO2: 28 mmol/L (ref 22–32)
pCO2, Ven: 54.2 mmHg (ref 44.0–60.0)
pH, Ven: 7.299 (ref 7.250–7.430)
pO2, Ven: 54 mmHg — ABNORMAL HIGH (ref 32.0–45.0)

## 2019-06-07 LAB — COMPREHENSIVE METABOLIC PANEL
ALT: 13 U/L (ref 0–44)
AST: 20 U/L (ref 15–41)
Albumin: 4.5 g/dL (ref 3.5–5.0)
Alkaline Phosphatase: 81 U/L (ref 38–126)
Anion gap: 15 (ref 5–15)
BUN: 10 mg/dL (ref 6–20)
CO2: 23 mmol/L (ref 22–32)
Calcium: 9.4 mg/dL (ref 8.9–10.3)
Chloride: 101 mmol/L (ref 98–111)
Creatinine, Ser: 1.13 mg/dL (ref 0.61–1.24)
GFR calc Af Amer: >60 mL/min (ref >60)
GFR calc non Af Amer: >60 mL/min (ref >60)
Glucose, Bld: 132 mg/dL — ABNORMAL HIGH (ref 70–99)
Potassium: 2.6 mmol/L — CL (ref 3.5–5.1)
Sodium: 139 mmol/L (ref 135–145)
Total Bilirubin: 0.5 mg/dL (ref 0.3–1.2)
Total Protein: 6.3 g/dL — ABNORMAL LOW (ref 6.5–8.1)

## 2019-06-07 LAB — CBC
HCT: 41.6 % (ref 39.0–52.0)
Hemoglobin: 14.5 g/dL (ref 13.0–17.0)
MCH: 31.4 pg (ref 26.0–34.0)
MCHC: 34.9 g/dL (ref 30.0–36.0)
MCV: 90 fL (ref 80.0–100.0)
Platelets: 306 10*3/uL (ref 150–400)
RBC: 4.62 MIL/uL (ref 4.22–5.81)
RDW: 12.2 % (ref 11.5–15.5)
WBC: 11.9 10*3/uL — ABNORMAL HIGH (ref 4.0–10.5)
nRBC: 0 % (ref 0.0–0.2)

## 2019-06-07 LAB — LIPASE, BLOOD: Lipase: 28 U/L (ref 11–51)

## 2019-06-07 LAB — RAPID URINE DRUG SCREEN, HOSP PERFORMED
Amphetamines: NOT DETECTED
Barbiturates: NOT DETECTED
Benzodiazepines: NOT DETECTED
Cocaine: NOT DETECTED
Opiates: NOT DETECTED
Tetrahydrocannabinol: NOT DETECTED

## 2019-06-07 LAB — ETHANOL: Alcohol, Ethyl (B): 27 mg/dL — ABNORMAL HIGH (ref <10)

## 2019-06-07 LAB — ABO/RH: ABO/RH(D): O POS

## 2019-06-07 LAB — MAGNESIUM: Magnesium: 2 mg/dL (ref 1.7–2.4)

## 2019-06-07 LAB — SARS CORONAVIRUS 2 BY RT PCR (HOSPITAL ORDER, PERFORMED IN ~~LOC~~ HOSPITAL LAB): SARS Coronavirus 2: NEGATIVE

## 2019-06-07 LAB — MRSA PCR SCREENING: MRSA by PCR: NEGATIVE

## 2019-06-07 SURGERY — LAPAROTOMY, EXPLORATORY
Anesthesia: General | Site: Abdomen | Laterality: Right

## 2019-06-07 MED ORDER — BUPIVACAINE-EPINEPHRINE (PF) 0.25% -1:200000 IJ SOLN
INTRAMUSCULAR | Status: AC
  Filled 2019-06-07: qty 30

## 2019-06-07 MED ORDER — POTASSIUM CHLORIDE CRYS ER 20 MEQ PO TBCR: 40.0000 meq | EXTENDED_RELEASE_TABLET | Freq: Once | ORAL | Status: DC

## 2019-06-07 MED ORDER — BUPIVACAINE LIPOSOME 1.3 % IJ SUSP
INTRAMUSCULAR | Status: DC | PRN
  Administered 2019-06-07: 20 mL

## 2019-06-07 MED ORDER — DIPHENHYDRAMINE HCL 25 MG PO CAPS
25.0000 mg | ORAL_CAPSULE | Freq: Four times a day (QID) | ORAL | Status: DC | PRN
  Administered 2019-06-15: 25 mg via ORAL
  Filled 2019-06-07: qty 1

## 2019-06-07 MED ORDER — IOPAMIDOL (ISOVUE-300) INJECTION 61%
100.0000 mL | Freq: Once | INTRAVENOUS | Status: AC | PRN
  Administered 2019-06-07: 100 mL via INTRAVENOUS

## 2019-06-07 MED ORDER — FENTANYL CITRATE (PF) 100 MCG/2ML IJ SOLN
INTRAMUSCULAR | Status: AC
  Administered 2019-06-07: 25 ug via INTRAVENOUS
  Filled 2019-06-07: qty 2

## 2019-06-07 MED ORDER — LACTATED RINGERS IV SOLN
INTRAVENOUS | Status: DC
  Administered 2019-06-07: 15:00:00 via INTRAVENOUS

## 2019-06-07 MED ORDER — SODIUM CHLORIDE 0.9 % IV SOLN
2.0000 g | INTRAVENOUS | Status: AC
  Administered 2019-06-07: 2 g via INTRAVENOUS

## 2019-06-07 MED ORDER — HYDROMORPHONE HCL 1 MG/ML IJ SOLN
0.5000 mg | Freq: Once | INTRAMUSCULAR | Status: AC
  Administered 2019-06-07: 0.5 mg via INTRAVENOUS
  Filled 2019-06-07: qty 1

## 2019-06-07 MED ORDER — CHLORHEXIDINE GLUCONATE CLOTH 2 % EX PADS
6.0000 | MEDICATED_PAD | Freq: Every day | CUTANEOUS | Status: DC
  Administered 2019-06-07 – 2019-06-18 (×9): 6 via TOPICAL

## 2019-06-07 MED ORDER — LIDOCAINE 2% (20 MG/ML) 5 ML SYRINGE
INTRAMUSCULAR | Status: AC
  Filled 2019-06-07: qty 5

## 2019-06-07 MED ORDER — PROMETHAZINE HCL 25 MG/ML IJ SOLN: 6.2500 mg | INTRAMUSCULAR | Status: DC | PRN

## 2019-06-07 MED ORDER — HYDROMORPHONE HCL 1 MG/ML IJ SOLN
0.5000 mg | INTRAMUSCULAR | Status: DC | PRN
  Filled 2019-06-07: qty 1

## 2019-06-07 MED ORDER — MORPHINE SULFATE (PF) 4 MG/ML IV SOLN
4.0000 mg | Freq: Once | INTRAVENOUS | Status: AC
  Administered 2019-06-07: 4 mg via INTRAVENOUS
  Filled 2019-06-07: qty 1

## 2019-06-07 MED ORDER — ALUM & MAG HYDROXIDE-SIMETH 200-200-20 MG/5ML PO SUSP
30.0000 mL | Freq: Once | ORAL | Status: DC
  Filled 2019-06-07: qty 30

## 2019-06-07 MED ORDER — METHOCARBAMOL 1000 MG/10ML IJ SOLN
500.0000 mg | Freq: Three times a day (TID) | INTRAVENOUS | Status: DC | PRN
  Filled 2019-06-07 (×2): qty 5

## 2019-06-07 MED ORDER — DIPHENHYDRAMINE HCL 50 MG/ML IJ SOLN
INTRAMUSCULAR | Status: DC | PRN
  Administered 2019-06-07: 12.5 mg via INTRAVENOUS

## 2019-06-07 MED ORDER — ONDANSETRON 4 MG PO TBDP
4.0000 mg | ORAL_TABLET | Freq: Four times a day (QID) | ORAL | Status: DC | PRN
  Administered 2019-06-15 (×2): 4 mg via ORAL
  Filled 2019-06-07: qty 1

## 2019-06-07 MED ORDER — HYDROMORPHONE HCL 1 MG/ML IJ SOLN
1.0000 mg | Freq: Once | INTRAMUSCULAR | Status: AC
  Administered 2019-06-07: 1 mg via INTRAVENOUS
  Filled 2019-06-07: qty 1

## 2019-06-07 MED ORDER — POTASSIUM CHLORIDE IN NACL 40-0.9 MEQ/L-% IV SOLN
INTRAVENOUS | Status: DC
  Administered 2019-06-07 – 2019-06-13 (×12): 100 mL/h via INTRAVENOUS
  Filled 2019-06-07 (×13): qty 1000

## 2019-06-07 MED ORDER — BUPIVACAINE LIPOSOME 1.3 % IJ SUSP
20.0000 mL | INTRAMUSCULAR | Status: AC
  Filled 2019-06-07: qty 20

## 2019-06-07 MED ORDER — ROCURONIUM BROMIDE 10 MG/ML (PF) SYRINGE
PREFILLED_SYRINGE | INTRAVENOUS | Status: AC
  Filled 2019-06-07: qty 10

## 2019-06-07 MED ORDER — PROPOFOL 10 MG/ML IV BOLUS
INTRAVENOUS | Status: DC | PRN
  Administered 2019-06-07: 100 mg via INTRAVENOUS

## 2019-06-07 MED ORDER — SUCCINYLCHOLINE CHLORIDE 200 MG/10ML IV SOSY
PREFILLED_SYRINGE | INTRAVENOUS | Status: DC | PRN
  Administered 2019-06-07: 100 mg via INTRAVENOUS

## 2019-06-07 MED ORDER — DEXAMETHASONE SODIUM PHOSPHATE 10 MG/ML IJ SOLN
INTRAMUSCULAR | Status: AC
  Filled 2019-06-07: qty 1

## 2019-06-07 MED ORDER — ONDANSETRON HCL 4 MG/2ML IJ SOLN
INTRAMUSCULAR | Status: DC | PRN
  Administered 2019-06-07: 4 mg via INTRAVENOUS

## 2019-06-07 MED ORDER — MEPERIDINE HCL 25 MG/ML IJ SOLN: 6.2500 mg | INTRAMUSCULAR | Status: DC | PRN

## 2019-06-07 MED ORDER — LIDOCAINE 2% (20 MG/ML) 5 ML SYRINGE
INTRAMUSCULAR | Status: DC | PRN
  Administered 2019-06-07: 40 mg via INTRAVENOUS

## 2019-06-07 MED ORDER — FENTANYL CITRATE (PF) 100 MCG/2ML IJ SOLN
25.0000 ug | Freq: Once | INTRAMUSCULAR | Status: AC
  Administered 2019-06-07: 14:00:00 25 ug via INTRAVENOUS
  Filled 2019-06-07: qty 0.5

## 2019-06-07 MED ORDER — SODIUM CHLORIDE 0.9% FLUSH
3.0000 mL | Freq: Once | INTRAVENOUS | Status: AC
  Administered 2019-06-07: 3 mL via INTRAVENOUS

## 2019-06-07 MED ORDER — DIPHENHYDRAMINE HCL 50 MG/ML IJ SOLN
INTRAMUSCULAR | Status: AC
  Filled 2019-06-07: qty 1

## 2019-06-07 MED ORDER — HYDROMORPHONE HCL 1 MG/ML IJ SOLN
INTRAMUSCULAR | Status: AC
  Filled 2019-06-07: qty 1

## 2019-06-07 MED ORDER — 0.9 % SODIUM CHLORIDE (POUR BTL) OPTIME
TOPICAL | Status: DC | PRN
  Administered 2019-06-07: 4000 mL

## 2019-06-07 MED ORDER — DIPHENHYDRAMINE HCL 50 MG/ML IJ SOLN
25.0000 mg | Freq: Four times a day (QID) | INTRAMUSCULAR | Status: DC | PRN
  Administered 2019-06-07 – 2019-06-18 (×13): 25 mg via INTRAVENOUS
  Filled 2019-06-07 (×13): qty 1

## 2019-06-07 MED ORDER — SUGAMMADEX SODIUM 500 MG/5ML IV SOLN
INTRAVENOUS | Status: AC
  Filled 2019-06-07: qty 5

## 2019-06-07 MED ORDER — LORAZEPAM 2 MG/ML IJ SOLN
1.0000 mg | Freq: Once | INTRAMUSCULAR | Status: AC
  Administered 2019-06-07: 1 mg via INTRAVENOUS
  Filled 2019-06-07: qty 1

## 2019-06-07 MED ORDER — ACETAMINOPHEN 10 MG/ML IV SOLN
1000.0000 mg | Freq: Four times a day (QID) | INTRAVENOUS | Status: DC
  Administered 2019-06-07 – 2019-06-08 (×3): 1000 mg via INTRAVENOUS
  Filled 2019-06-07 (×3): qty 100

## 2019-06-07 MED ORDER — ROCURONIUM BROMIDE 50 MG/5ML IV SOSY
PREFILLED_SYRINGE | INTRAVENOUS | Status: DC | PRN
  Administered 2019-06-07: 50 mg via INTRAVENOUS

## 2019-06-07 MED ORDER — SIMETHICONE 80 MG PO CHEW: 160.0000 mg | CHEWABLE_TABLET | Freq: Once | ORAL | Status: DC

## 2019-06-07 MED ORDER — DEXAMETHASONE SODIUM PHOSPHATE 10 MG/ML IJ SOLN
INTRAMUSCULAR | Status: DC | PRN
  Administered 2019-06-07: 5 mg via INTRAVENOUS

## 2019-06-07 MED ORDER — LACTATED RINGERS IV SOLN
INTRAVENOUS | Status: DC | PRN
  Administered 2019-06-07 (×2): via INTRAVENOUS

## 2019-06-07 MED ORDER — FENTANYL CITRATE (PF) 250 MCG/5ML IJ SOLN
INTRAMUSCULAR | Status: AC
  Filled 2019-06-07: qty 5

## 2019-06-07 MED ORDER — ENOXAPARIN SODIUM 40 MG/0.4ML ~~LOC~~ SOLN
40.0000 mg | SUBCUTANEOUS | Status: DC
  Administered 2019-06-08 – 2019-06-09 (×2): 40 mg via SUBCUTANEOUS
  Filled 2019-06-07 (×2): qty 0.4

## 2019-06-07 MED ORDER — FENTANYL CITRATE (PF) 250 MCG/5ML IJ SOLN
INTRAMUSCULAR | Status: DC | PRN
  Administered 2019-06-07: 25 ug via INTRAVENOUS
  Administered 2019-06-07: 150 ug via INTRAVENOUS
  Administered 2019-06-07: 25 ug via INTRAVENOUS
  Administered 2019-06-07: 50 ug via INTRAVENOUS

## 2019-06-07 MED ORDER — ONDANSETRON HCL 4 MG/2ML IJ SOLN
4.0000 mg | Freq: Once | INTRAMUSCULAR | Status: AC
  Administered 2019-06-07: 4 mg via INTRAVENOUS
  Filled 2019-06-07: qty 2

## 2019-06-07 MED ORDER — ONDANSETRON HCL 4 MG/2ML IJ SOLN
INTRAMUSCULAR | Status: AC
  Filled 2019-06-07: qty 2

## 2019-06-07 MED ORDER — PANTOPRAZOLE SODIUM 40 MG IV SOLR
40.0000 mg | Freq: Every day | INTRAVENOUS | Status: DC
  Administered 2019-06-07 – 2019-06-12 (×6): 40 mg via INTRAVENOUS
  Filled 2019-06-07 (×6): qty 40

## 2019-06-07 MED ORDER — SUGAMMADEX SODIUM 200 MG/2ML IV SOLN
INTRAVENOUS | Status: DC | PRN
  Administered 2019-06-07: 118 mg via INTRAVENOUS

## 2019-06-07 MED ORDER — POTASSIUM CHLORIDE 10 MEQ/100ML IV SOLN
10.0000 meq | INTRAVENOUS | Status: AC
  Administered 2019-06-07 (×2): 10 meq via INTRAVENOUS
  Filled 2019-06-07 (×2): qty 100

## 2019-06-07 MED ORDER — ALBUMIN HUMAN 5 % IV SOLN
INTRAVENOUS | Status: DC | PRN
  Administered 2019-06-07: 15:00:00 via INTRAVENOUS

## 2019-06-07 MED ORDER — ONDANSETRON HCL 4 MG/2ML IJ SOLN
4.0000 mg | Freq: Four times a day (QID) | INTRAMUSCULAR | Status: DC | PRN
  Administered 2019-06-10 – 2019-06-15 (×6): 4 mg via INTRAVENOUS
  Filled 2019-06-07 (×8): qty 2

## 2019-06-07 MED ORDER — MIDAZOLAM HCL 2 MG/2ML IJ SOLN
INTRAMUSCULAR | Status: DC | PRN
  Administered 2019-06-07: 2 mg via INTRAVENOUS

## 2019-06-07 MED ORDER — POTASSIUM CHLORIDE 10 MEQ/100ML IV SOLN
10.0000 meq | Freq: Once | INTRAVENOUS | Status: AC
  Administered 2019-06-07: 10 meq via INTRAVENOUS
  Filled 2019-06-07: qty 100

## 2019-06-07 MED ORDER — SODIUM CHLORIDE 0.9 % IV SOLN
INTRAVENOUS | Status: AC
  Filled 2019-06-07: qty 2

## 2019-06-07 MED ORDER — LIDOCAINE VISCOUS HCL 2 % MT SOLN
15.0000 mL | Freq: Once | OROMUCOSAL | Status: DC
  Filled 2019-06-07: qty 15

## 2019-06-07 MED ORDER — KETOROLAC TROMETHAMINE 15 MG/ML IJ SOLN: 15.0000 mg | Freq: Three times a day (TID) | INTRAMUSCULAR | Status: DC | PRN

## 2019-06-07 MED ORDER — MIDAZOLAM HCL 2 MG/2ML IJ SOLN
INTRAMUSCULAR | Status: AC
  Filled 2019-06-07: qty 2

## 2019-06-07 MED ORDER — HYDROMORPHONE HCL 1 MG/ML IJ SOLN: 0.2500 mg | INTRAMUSCULAR | Status: DC | PRN

## 2019-06-07 MED ORDER — SUCCINYLCHOLINE CHLORIDE 200 MG/10ML IV SOSY
PREFILLED_SYRINGE | INTRAVENOUS | Status: AC
  Filled 2019-06-07: qty 10

## 2019-06-07 MED ORDER — HYDROMORPHONE HCL 1 MG/ML IJ SOLN
0.5000 mg | INTRAMUSCULAR | Status: DC | PRN
  Administered 2019-06-07: 1 mg via INTRAVENOUS
  Administered 2019-06-07 – 2019-06-08 (×4): 2 mg via INTRAVENOUS
  Filled 2019-06-07: qty 1
  Filled 2019-06-07 (×2): qty 2
  Filled 2019-06-07: qty 1
  Filled 2019-06-07 (×2): qty 2

## 2019-06-07 MED ORDER — MIDAZOLAM HCL 2 MG/2ML IJ SOLN: 0.5000 mg | Freq: Once | INTRAMUSCULAR | Status: DC | PRN

## 2019-06-07 MED ORDER — HYDRALAZINE HCL 20 MG/ML IJ SOLN: 10.0000 mg | INTRAMUSCULAR | Status: DC | PRN

## 2019-06-07 SURGICAL SUPPLY — 65 items
APL PRP STRL LF DISP 70% ISPRP (MISCELLANEOUS) ×2
APPLIER CLIP 5 13 M/L LIGAMAX5 (MISCELLANEOUS)
APR CLP MED LRG 5 ANG JAW (MISCELLANEOUS)
BLADE CLIPPER SURG (BLADE) ×1 IMPLANT
CANISTER SUCT 3000ML PPV (MISCELLANEOUS) ×3 IMPLANT
CELLS DAT CNTRL 66122 CELL SVR (MISCELLANEOUS) IMPLANT
CHLORAPREP W/TINT 26 (MISCELLANEOUS) ×3 IMPLANT
CLIP APPLIE 5 13 M/L LIGAMAX5 (MISCELLANEOUS) IMPLANT
COVER SURGICAL LIGHT HANDLE (MISCELLANEOUS) ×6 IMPLANT
COVER WAND RF STERILE (DRAPES) ×3 IMPLANT
DRAPE HALF SHEET 40X57 (DRAPES) ×2 IMPLANT
DRAPE LAPAROSCOPIC ABDOMINAL (DRAPES) ×3 IMPLANT
DRAPE UTILITY XL STRL (DRAPES) ×1 IMPLANT
DRAPE WARM FLUID 44X44 (DRAPES) ×3 IMPLANT
DRSG OPSITE POSTOP 4X10 (GAUZE/BANDAGES/DRESSINGS) ×1 IMPLANT
DRSG OPSITE POSTOP 4X8 (GAUZE/BANDAGES/DRESSINGS) IMPLANT
ELECT BLADE 6.5 EXT (BLADE) IMPLANT
ELECT CAUTERY BLADE 6.4 (BLADE) ×6 IMPLANT
ELECT REM PT RETURN 9FT ADLT (ELECTROSURGICAL) ×3
ELECTRODE REM PT RTRN 9FT ADLT (ELECTROSURGICAL) ×2 IMPLANT
GEL ULTRASOUND 20GR AQUASONIC (MISCELLANEOUS) IMPLANT
GLOVE BIO SURGEON STRL SZ7 (GLOVE) ×6 IMPLANT
GLOVE BIOGEL PI IND STRL 7.5 (GLOVE) ×4 IMPLANT
GLOVE BIOGEL PI INDICATOR 7.5 (GLOVE) ×2
GOWN STRL REUS W/ TWL LRG LVL3 (GOWN DISPOSABLE) ×12 IMPLANT
GOWN STRL REUS W/TWL LRG LVL3 (GOWN DISPOSABLE) ×18
HANDLE SUCTION POOLE (INSTRUMENTS) ×2 IMPLANT
KIT BASIN OR (CUSTOM PROCEDURE TRAY) ×3 IMPLANT
KIT TURNOVER KIT B (KITS) ×3 IMPLANT
LIGASURE IMPACT 36 18CM CVD LR (INSTRUMENTS) ×1 IMPLANT
NS IRRIG 1000ML POUR BTL (IV SOLUTION) ×6 IMPLANT
PACK COLON (CUSTOM PROCEDURE TRAY) ×3 IMPLANT
PACK GENERAL/GYN (CUSTOM PROCEDURE TRAY) ×3 IMPLANT
PAD ARMBOARD 7.5X6 YLW CONV (MISCELLANEOUS) ×6 IMPLANT
PENCIL SMOKE EVACUATOR (MISCELLANEOUS) ×6 IMPLANT
RELOAD PROXIMATE 75MM BLUE (ENDOMECHANICALS) ×9 IMPLANT
RELOAD STAPLE 75 3.8 BLU REG (ENDOMECHANICALS) IMPLANT
RETRACTOR WND ALEXIS 18 MED (MISCELLANEOUS) IMPLANT
RTRCTR WOUND ALEXIS 18CM MED (MISCELLANEOUS)
SCISSORS LAP 5X35 DISP (ENDOMECHANICALS) ×2 IMPLANT
SHEARS HARMONIC ACE PLUS 36CM (ENDOMECHANICALS) ×2 IMPLANT
SLEEVE ENDOPATH XCEL 5M (ENDOMECHANICALS) ×2 IMPLANT
SPECIMEN JAR LARGE (MISCELLANEOUS) ×3 IMPLANT
SPONGE LAP 18X18 RF (DISPOSABLE) IMPLANT
STAPLER GUN LINEAR PROX 60 (STAPLE) ×1 IMPLANT
STAPLER PROXIMATE 75MM BLUE (STAPLE) ×1 IMPLANT
STAPLER VISISTAT 35W (STAPLE) ×3 IMPLANT
SUCTION POOLE HANDLE (INSTRUMENTS) ×3
SURGILUBE 2OZ TUBE FLIPTOP (MISCELLANEOUS) IMPLANT
SUT PDS AB 1 TP1 96 (SUTURE) ×6 IMPLANT
SUT PROLENE 2 0 CT2 30 (SUTURE) IMPLANT
SUT PROLENE 2 0 KS (SUTURE) IMPLANT
SUT SILK 2 0 SH CR/8 (SUTURE) ×3 IMPLANT
SUT SILK 2 0 TIES 10X30 (SUTURE) ×3 IMPLANT
SUT SILK 3 0 SH CR/8 (SUTURE) ×3 IMPLANT
SUT SILK 3 0 TIES 10X30 (SUTURE) ×3 IMPLANT
SUT VIC AB 3-0 SH 18 (SUTURE) IMPLANT
SYS LAPSCP GELPORT 120MM (MISCELLANEOUS)
SYSTEM LAPSCP GELPORT 120MM (MISCELLANEOUS) IMPLANT
TOWEL GREEN STERILE (TOWEL DISPOSABLE) ×3 IMPLANT
TRAY FOLEY MTR SLVR 16FR STAT (SET/KITS/TRAYS/PACK) ×3 IMPLANT
TRAY PROCTOSCOPIC FIBER OPTIC (SET/KITS/TRAYS/PACK) IMPLANT
TUBE CONNECTING 12X1/4 (SUCTIONS) ×5 IMPLANT
WATER STERILE IRR 1000ML POUR (IV SOLUTION) ×3 IMPLANT
YANKAUER SUCT BULB TIP NO VENT (SUCTIONS) ×1 IMPLANT

## 2019-06-07 NOTE — ED Notes (Signed)
Surgeon at bedside.  

## 2019-06-07 NOTE — ED Notes (Signed)
Upon entry to room pt rolling around bed, groaning loudly, pt guarding lower abdomen, pt requesting ativan before IV start or blood draw. Pt stating he will pass out soon, pt encouraged to take slow breaths, IV established , labs drawn. PA at bedside. Pt reports he was up playing xbox since getting off work at [employer], at [place] and drank beer. Pt reports nausea with severe pain, denies v/d. Normal BM last night. BS active.

## 2019-06-07 NOTE — H&P (Addendum)
[facility] Surgery Consult/Admission Note  Austin [name] 1991-09-07  [id number].    Requesting Provider: Buel Ream, PA-C Chief Complaint/Reason for Consult: abdominal pain, volvulus   HPI:   Pt is an otherwise healthy 28 yo male with a hx of BL laparoscopy inguinal hernia repairs early this year by Dr. Michaell Cowing who presented to the ED with complaints of abdominal pain. Pt states sudden onset of pain after eating last night. Pain is generalized, severe, constant with associated bloating and nausea. Pt denies vomiting. He states he has never had this type of pain before. He states some hx of constipation but has been having regular BM's recently. His last BM was yesterday. He denies urinary symptoms, cough, SOB, CP, illicit drug use, health issues, medication allergies. Pt states he drinks 2-3 beers a week. He would like [place] to call his mother with the plan.   Pt works as a Chartered certified accountant.   ROS:  Review of Systems  Constitutional: Negative for chills, diaphoresis and fever.  HENT: Negative for sore throat.   Respiratory: Negative for cough and shortness of breath.   Cardiovascular: Negative for chest pain.  Gastrointestinal: Positive for abdominal pain and nausea. Negative for blood in stool, constipation, diarrhea and vomiting.  Genitourinary: Negative for dysuria and hematuria.  Skin: Negative for rash.  Neurological: Negative for dizziness and loss of consciousness.  All other systems reviewed and are negative.    Family History  Problem Relation Age of Onset  . Hypertension Father   . Lung cancer Paternal Grandmother     Past Medical History:  Diagnosis Date  . ADD (attention deficit disorder)     Past Surgical History:  Procedure Laterality Date  . FETAL SURGERY FOR CONGENITAL HERNIA    . MOUTH SURGERY      Social History:  reports that he has been smoking e-cigarettes. He has been smoking about 0.00 packs per day. He has never used smokeless tobacco. He reports that  he does not drink alcohol or use drugs.  Allergies: No Known Allergies  (Not in a hospital admission)   Blood pressure 134/75, pulse (!) 102, temperature 98.6 F (37 C), temperature source Oral, resp. rate 18, height 6' (1.829 m), SpO2 100 %.  Physical Exam Vitals signs and nursing note reviewed.  Constitutional:      General: He is not in acute distress.    Appearance: Normal appearance. He is underweight. He is ill-appearing. He is not diaphoretic.     Comments: Pt appears very uncomfortable  HENT:     Head: Normocephalic and atraumatic.     Nose: Nose normal.     Mouth/Throat:     Lips: Pink.     Mouth: Mucous membranes are moist.     Pharynx: Oropharynx is clear.  Eyes:     General: No scleral icterus.       Right eye: No discharge.        Left eye: No discharge.     Conjunctiva/sclera: Conjunctivae normal.     Pupils: Pupils are equal, round, and reactive to light.  Neck:     Musculoskeletal: Normal range of motion and neck supple.  Cardiovascular:     Rate and Rhythm: Normal rate and regular rhythm.     Pulses:          Radial pulses are 2+ on the right side and 2+ on the left side.       Dorsalis pedis pulses are 2+ on the right side and 2+ on the  left side.     Heart sounds: Normal heart sounds. No murmur.  Pulmonary:     Effort: Pulmonary effort is normal. No respiratory distress.     Breath sounds: Normal breath sounds. No wheezing, rhonchi or rales.  Abdominal:     General: Bowel sounds are normal. There is no distension.     Palpations: Abdomen is soft. Abdomen is not rigid.     Tenderness: There is generalized abdominal tenderness. There is guarding.     Hernia: No hernia is present.     Comments: Port sites from previous laparoscopy surgery are well healed  Musculoskeletal: Normal range of motion.        General: No tenderness or deformity.     Right lower leg: No edema.     Left lower leg: No edema.  Skin:    General: Skin is warm and dry.      Findings: No rash.  Neurological:     Mental Status: He is alert and oriented to person, place, and time.     GCS: GCS eye subscore is 4. GCS verbal subscore is 5. GCS motor subscore is 6.     Results for orders placed or performed during the hospital encounter of 06/07/19 (from the past 48 hour(s))  Lipase, blood     Status: None   Collection Time: 06/07/19  6:00 AM  Result Value Ref Range   Lipase 28 11 - 51 U/L    Comment: Performed at [facility], [address] [place], [place] [address]  Comprehensive metabolic panel     Status: Abnormal   Collection Time: 06/07/19  6:00 AM  Result Value Ref Range   Sodium 139 135 - 145 mmol/L   Potassium 2.6 (LL) 3.5 - 5.1 mmol/L    Comment: NO VISIBLE HEMOLYSIS CRITICAL RESULT CALLED TO, READ BACK BY AND VERIFIED WITH: DENNIS,A RN @0700  ON [id number] BY FLEMINGS    Chloride 101 98 - 111 mmol/L   CO2 23 22 - 32 mmol/L   Glucose, Bld 132 (H) 70 - 99 mg/dL   BUN 10 6 - 20 mg/dL   Creatinine, Ser 1.30 0.61 - 1.24 mg/dL   Calcium 9.4 8.9 - 86.5 mg/dL   Total Protein 6.3 (L) 6.5 - 8.1 g/dL   Albumin 4.5 3.5 - 5.0 g/dL   AST 20 15 - 41 U/L   ALT 13 0 - 44 U/L   Alkaline Phosphatase 81 38 - 126 U/L   Total Bilirubin 0.5 0.3 - 1.2 mg/dL   GFR calc non Af Amer >60 >60 mL/min   GFR calc Af Amer >60 >60 mL/min   Anion gap 15 5 - 15    Comment: Performed at [facility], [address] [place], [place] [address]  CBC     Status: Abnormal   Collection Time: 06/07/19  6:00 AM  Result Value Ref Range   WBC 11.9 (H) 4.0 - 10.5 K/uL   RBC 4.62 4.22 - 5.81 MIL/uL   Hemoglobin 14.5 13.0 - 17.0 g/dL   HCT 62.9 52.8 - 41.3 %   MCV 90.0 80.0 - 100.0 fL   MCH 31.4 26.0 - 34.0 pg   MCHC 34.9 30.0 - 36.0 g/dL   RDW 24.4 01.0 - 27.2 %   Platelets 306 150 - 400 K/uL   nRBC 0.0 0.0 - 0.2 %    Comment: Performed at [facility], [address] [place], [place] [address]  Ethanol  Status: Abnormal   Collection Time: 06/07/19  6:00  AM  Result Value Ref Range   Alcohol, Ethyl (B) 27 (H) <10 mg/dL    Comment: (NOTE) Lowest detectable limit for serum alcohol is 10 mg/dL. For medical purposes only. Performed at [facility], [address] [place], [place] [address]   Magnesium     Status: None   Collection Time: 06/07/19  6:00 AM  Result Value Ref Range   Magnesium 2.0 1.7 - 2.4 mg/dL    Comment: Performed at [facility], [address] [place], [place] [address]  Urinalysis, Routine w reflex microscopic     Status: None   Collection Time: 06/07/19  8:50 AM  Result Value Ref Range   Color, Urine YELLOW YELLOW   APPearance CLEAR CLEAR   Specific Gravity, Urine 1.012 1.005 - 1.030   pH 6.0 5.0 - 8.0   Glucose, UA NEGATIVE NEGATIVE mg/dL   Hgb urine dipstick NEGATIVE NEGATIVE   Bilirubin Urine NEGATIVE NEGATIVE   Ketones, ur NEGATIVE NEGATIVE mg/dL   Protein, ur NEGATIVE NEGATIVE mg/dL   Nitrite NEGATIVE NEGATIVE   Leukocytes,Ua NEGATIVE NEGATIVE    Comment: Performed at [facility], [address] [place], [place] [address]  Rapid urine drug screen (hospital performed)     Status: None   Collection Time: 06/07/19  8:50 AM  Result Value Ref Range   Opiates NONE DETECTED NONE DETECTED   Cocaine NONE DETECTED NONE DETECTED   Benzodiazepines NONE DETECTED NONE DETECTED   Amphetamines NONE DETECTED NONE DETECTED   Tetrahydrocannabinol NONE DETECTED NONE DETECTED   Barbiturates NONE DETECTED NONE DETECTED    Comment: (NOTE) DRUG SCREEN FOR MEDICAL PURPOSES ONLY.  IF CONFIRMATION IS NEEDED FOR ANY PURPOSE, NOTIFY LAB WITHIN 5 DAYS. LOWEST DETECTABLE LIMITS FOR URINE DRUG SCREEN Drug Class                     Cutoff (ng/mL) Amphetamine and metabolites    1000 Barbiturate and metabolites    200 Benzodiazepine                 200 Tricyclics and metabolites     300 Opiates and metabolites        300 Cocaine and metabolites        300 THC                            50 Performed at  [facility], [address], [place], [place] [address]    Ct Abdomen Pelvis W Contrast  Result Date: 06/07/2019 CLINICAL DATA:  Anterior abdominal pain since this morning. EXAM: CT ABDOMEN AND PELVIS WITH CONTRAST TECHNIQUE: Multidetector CT imaging of the abdomen and pelvis was performed using the standard protocol following bolus administration of intravenous contrast. CONTRAST:  ISOVUE-300 IOPAMIDOL (ISOVUE-300) INJECTION 61% COMPARISON:  None. FINDINGS: Lower chest: No acute abnormality. Hepatobiliary: No focal liver abnormality is seen. No gallstones, gallbladder wall thickening, or biliary dilatation. Pancreas: Unremarkable. No pancreatic ductal dilatation or surrounding inflammatory changes. Spleen: Normal in size without focal abnormality. Adrenals/Urinary Tract: Adrenal glands appear normal. LEFT renal cyst. No renal stone or hydronephrosis bilaterally. Bladder is unremarkable. Stomach/Bowel: Markedly distended sigmoid colon filled with stool and gas. Tortuous configuration of the lower sigmoid colon suggesting sigmoid volvulus (coronal series 6, images 95 through 117; axial series 3, images 62-69). Irregular thickening of the walls of the lower  sigmoid colon, at the transition zone to the relatively decompressed distal rectosigmoid colon, which could represent underlying colitis, inflammatory bowel disease, bowel ischemia or reactive thickening. More proximal colon is normal in caliber. No dilated small bowel loops seen. Stomach is unremarkable, partially decompressed. Small bowel is mostly within the RIGHT abdomen suggesting a bowel malrotation. Alternatively, the markedly distendedw segment of colon within the central abdomen and upper pelvis could be cecum with cecal volvulus. Vascular/Lymphatic: No significant vascular findings are present. No enlarged abdominal or pelvic lymph nodes. Reproductive: Prostate is unremarkable. Other: Moderate amount of free fluid in the pelvis.  Additional small amount of free fluid within the RIGHT pericolic gutter and RIGHT upper quadrant. No abscess collection seen. No free intraperitoneal air seen. Musculoskeletal: No acute appearing osseous abnormality. Mild levoscoliosis which may be accentuated by patient positioning. IMPRESSION: 1. Markedly distended colon within the central abdomen and upper pelvis, filled with stool and gas, with tortuous configuration suggesting sigmoid volvulus or cecal volvulus. Cecal volvulus is a consideration given the apparent small bowel malrotation. There is irregular thickening of the walls of the bowel at and just distal to the colonic narrowing, which could represent underlying infectious, inflammatory or ischemic process and/or reactive bowel thickening. Immediate surgical consultation recommended. 2. Moderate amount of free fluid in the pelvis. No abscess collection seen. No free intraperitoneal air seen. 3. Small bowel is mostly within the RIGHT abdomen, suggesting a bowel malrotation. 4. Remainder of the abdomen and pelvis CT is unremarkable, as detailed above. These results were called by telephone at the time of interpretation on 06/07/2019 at 11:10 am to Dr. Buel Ream , who verbally acknowledged these results. Electronically Signed   By: Bary Richard M.D.   On: 06/07/2019 11:14   Dg Abd Acute 2+v W 1v Chest  Result Date: 06/07/2019 CLINICAL DATA:  Abdominal pain EXAM: DG ABDOMEN ACUTE W/ 1V CHEST COMPARISON:  None. FINDINGS: Colonic distention by stool and gas. No small bowel distention or evident pneumoperitoneum. No concerning mass effect or calcification. There is no edema, consolidation, effusion, or pneumothorax. Normal heart size and mediastinal contours. IMPRESSION: Distended colon by stool distally and gas proximally. Electronically Signed   By: Marnee Spring M.D.   On: 06/07/2019 06:56      Assessment/Plan Active Problems:   * No active hospital problems. *  Hypokalemia - EDP ordered  IV K, monitor Abdominal pain Cecal vs sigmoid volvulus - OR for ex lap  FEN: NPO, IVF VTE: SCD's ID: no abx, afebrile, WBC 11.9 Foley: none Follow up: TBD  Plan: admit to [facility], IVF, NPO, Or for ex lap, possible colon resection, possible colostomy   Jerre Simon, [facility] Surgery 06/07/2019, 12:18 PM Pager: [phone] Consults: [phone] Mon-Fri 7:00 am-4:30 pm Sat-Sun 7:00 am-11:30 am

## 2019-06-07 NOTE — Anesthesia Preprocedure Evaluation (Signed)
Anesthesia Evaluation  Patient identified by MRN, date of birth, ID band Patient awake    Reviewed: Allergy & Precautions, NPO status , Patient's Chart, lab work & pertinent test results  History of Anesthesia Complications Negative for: history of anesthetic complications  Airway Mallampati: II  TM Distance: >3 FB Neck ROM: Full    Dental  (+) Dental Advisory Given   Pulmonary Current Smoker,  06/07/2019 SARS coronavirus NEG   breath sounds clear to auscultation       Cardiovascular negative cardio ROS   Rhythm:Regular Rate:Normal     Neuro/Psych PSYCHIATRIC DISORDERS (ADHD) Anxiety    GI/Hepatic Neg liver ROS, N/v with acute abdomen   Endo/Other  negative endocrine ROS  Renal/GU negative Renal ROS     Musculoskeletal   Abdominal   Peds  Hematology negative hematology ROS (+)   Anesthesia Other Findings   Reproductive/Obstetrics                             Anesthesia Physical Anesthesia Plan  ASA: II and emergent  Anesthesia Plan: General   Post-op Pain Management:    Induction: Intravenous and Rapid sequence  PONV Risk Score and Plan: 2 and Ondansetron and Dexamethasone  Airway Management Planned: Oral ETT  Additional Equipment:   Intra-op Plan:   Post-operative Plan: Extubation in OR  Informed Consent: I have reviewed the patients History and Physical, chart, labs and discussed the procedure including the risks, benefits and alternatives for the proposed anesthesia with the patient or authorized representative who has indicated his/her understanding and acceptance.     Dental advisory given  Plan Discussed with: CRNA and Surgeon  Anesthesia Plan Comments:         Anesthesia Quick Evaluation

## 2019-06-07 NOTE — ED Notes (Signed)
Pt is NSR on monitor 

## 2019-06-07 NOTE — Op Note (Addendum)
Preop diagnosis: Cecal volvulus; malrotation Postop diagnosis: Cecal volvulus with ischemic right colon and terminal ileum; malrotation Procedure performed: Exploratory laparotomy with right hemicolectomy Surgeon:Lani Havlik K Tyrina Hines Assistant: Dr. Axel FillerArmando Ramirez Anesthesia: General endotracheal Indications: This is a 28 year old male in good health who presents with sudden onset of generalized abdominal pain and distention associated with nausea.  He underwent work-up in the emergency department.  CT scan showed findings consistent with cecal volvulus.  The patient has malrotation and his small bowel is all on the right side of his abdomen and his colon seems to all be on the left side.  We examined the patient.  He had peritonitis and we proceeded directly to the operating room for emergent exploratory laparotomy.  Description of procedure: The patient is brought to the operating room and placed in the supine position on the operating room table.  After an adequate level of general anesthesia was obtained, a Foley catheter was placed under sterile technique.  The patient's abdomen was prepped with ChloraPrep and draped in sterile fashion.  A timeout was taken to ensure the proper patient and proper procedure.  A vertical midline incision was made.  Dissection was carried down through a very thin layer of adipose tissue to the fascia.  We open the linea alba.  The peritoneum was grasped between hemostats and sharply opened.  We entered the peritoneum and opened it widely along the length of the incision.  There is obvious ischemic colon directly behind the linea alba.  The cecum seems to be in the center of the abdomen.  All the small bowel seems to be on the patient's right side.  The right colon is enormously dilated and there are some serosal tears in the cecum.  We were able to gently deliver the enlarged cecal volvulus up into the wound.  We are able untwist the mesentery.  The most distal 3 cm of the  terminal ileum are ischemic.  The area of ischemia extends up the right colon to what would normally be the hepatic flexure.  We divided the terminal ileum 10 cm proximal to the ileocecal valve with a GIA-75 stapler.  We divided what should be the transverse colon with a GIA-75 stapler.  The mesentery was taken with the LigaSure device.  The specimen was passed off the field and sent for pathologic examination.  We then ran all the bowel.  We identified the ligament of Treitz which is actually located on the right side of midline.  We ran the small bowel distally to the staple line.  There are no Ladd's bands to the right upper quadrant.  There are no other areas of ischemia or obstruction.  We ran the colon from the staple line in the transverse colon down the patient's left side.  He has hard stool within the sigmoid colon.  The sigmoid colon is very redundant but there is no sign of twist or obstruction.  All this appears healthy and viable.  The liver appears healthy as does the gallbladder.  The stomach was palpated and shows no sign of ischemia.  The nasogastric tube was palpated within the stomach.  We then irrigated the abdomen thoroughly and inspected for hemostasis.  We created a side-to-side ileocolic anastomosis with a GIA-75 stapler.  A TA 60 stapler was used to close the common defect.  A crotch suture of 3-0 silk was placed.  The mesentery was closed with 2-0 silk sutures.  The omentum was placed over the anastomosis.  We again  irrigated.  We changed our gowns and gloves in accordance to the colorectal protocol.  The fascia was infiltrated with 20 cc of Exparel.  The fascia was reapproximated with double-stranded #1 PDS suture.  Staples were used to close the skin.  A honeycomb dressing was applied.  The patient was then extubated and brought to the recovery room in stable condition.  All sponge, instrument, and needle counts are correct.  Imogene Burn. Georgette Dover, MD, [facility] Beeper [phone]  06/07/2019 4:41 PM

## 2019-06-07 NOTE — ED Triage Notes (Signed)
Onset of abdominal pain and bloating around 4 am, with nausea, belching,  and dry heaves. Pt says he is very anxious because of being here, his mother is renee Cortina can be reached at [phone]. Reports 2 beers tonight.

## 2019-06-07 NOTE — ED Notes (Signed)
Pt to xray via stretcher

## 2019-06-07 NOTE — Transfer of Care (Signed)
Immediate Anesthesia Transfer of Care Note  Patient: Austin [name]  Procedure(s) Performed: EXPLORATORY LAPAROTOMY (Right Abdomen) Right Hemi Colectomy (Right Abdomen)  Patient Location: PACU  Anesthesia Type:General  Level of Consciousness: drowsy and patient cooperative  Airway & Oxygen Therapy: Patient Spontanous Breathing and Patient connected to face mask oxygen  Post-op Assessment: Report given to RN and Post -op Vital signs reviewed and stable  Post vital signs: Reviewed and stable  Last Vitals:  Vitals Value Taken Time  BP 140/67 06/07/19 1628  Temp    Pulse 94 06/07/19 1630  Resp 17 06/07/19 1630  SpO2 100 % 06/07/19 1630  Vitals shown include unvalidated device data.  Last Pain:  Vitals:   06/07/19 1325  TempSrc:   PainSc: Asleep         Complications: No apparent anesthesia complications

## 2019-06-07 NOTE — ED Notes (Signed)
Pt constantly pressing call light multiple times a minute, states he is in pain, staff has checked on him and let him know that Alana RN has asked for pain medicine and we can not give it without an order. Pt states he can not breathe and appears to be "panicking", pt not in respiratory distress and satting upper 90's.

## 2019-06-07 NOTE — Anesthesia Procedure Notes (Signed)
Procedure Name: Intubation Date/Time: 06/07/2019 3:26 PM Performed by: Kathryne Hitch, CRNA Pre-anesthesia Checklist: Patient identified, Emergency Drugs available, Suction available and Patient being monitored Patient Re-evaluated:Patient Re-evaluated prior to induction Oxygen Delivery Method: Circle system utilized Preoxygenation: Pre-oxygenation with 100% oxygen Induction Type: IV induction and Rapid sequence Laryngoscope Size: Miller and 2 Grade View: Grade I Tube type: Oral Tube size: 7.5 mm Number of attempts: 1 Airway Equipment and Method: Stylet and Oral airway Placement Confirmation: ETT inserted through vocal cords under direct vision,  positive ETCO2 and breath sounds checked- equal and bilateral Secured at: 23 cm Tube secured with: Tape Dental Injury: Teeth and Oropharynx as per pre-operative assessment

## 2019-06-07 NOTE — ED Provider Notes (Signed)
[facility]   CSN: [id number] Arrival date & time: 06/07/19  0540     History   Chief Complaint Chief Complaint  Patient presents with  . Abdominal Pain    HPI Austin [name] is a 28 y.o. male.     The history is provided by the patient and medical records.  Abdominal Pain Associated symptoms: nausea      28 year old male with history of ADD and anxiety, presenting to the ED with abdominal pain.  States symptoms began around 4 AM.  He reports generalized abdominal pain, bloating, nausea, burping, and feeling like he is going to vomit.  He did drink 2 beers last evening.  Denies any drug use.  He did urinate prior to arrival and has had regular bowel movements.  Has had prior inguinal hernia repair in February of this year.  No other abdominal surgeries.  Patient is extremely anxious.  Past Medical History:  Diagnosis Date  . ADD (attention deficit disorder)     Patient Active Problem List   Diagnosis Date Noted  . BMI less than 19,adult 05/30/2018  . Anxiety 12/09/2017  . Right inguinal hernia 12/09/2017  . ADD (attention deficit disorder)     Past Surgical History:  Procedure Laterality Date  . FETAL SURGERY FOR CONGENITAL HERNIA    . MOUTH SURGERY          Home Medications    Prior to Admission medications   Medication Sig Start Date End Date Taking? Authorizing Provider  ALPRAZolam (XANAX) 1 MG tablet TAKE 1 TAB PRIOR TO BLOOD DRAWS PRIOR TO SURGERY AS NEEDED FOR ANXIETY/PANIC ATTACKS. 01/12/18   Judd Gaudierorbett, Ashley, NP  amoxicillin (AMOXIL) 875 MG tablet Take 1 tablet (875 mg total) by mouth 2 (two) times daily. 05/15/17   Deatra Canterxford, William J, FNP  buPROPion (WELLBUTRIN XL) 150 MG 24 hr tablet Take 1 tablet (150 mg total) by mouth daily. Start with 1 tab daily; may increase to 2 tabs daily after 1 week. 12/09/17 12/09/18  Judd Gaudierorbett, Ashley, NP  busPIRone (BUSPAR) 10 MG tablet Take 1/2-1 tab up to three times daily as  needed for anxiety 12/09/17   Judd Gaudierorbett, Ashley, NP  ondansetron (ZOFRAN ODT) 4 MG disintegrating tablet Take 1 tablet (4 mg total) by mouth every 8 (eight) hours as needed for nausea or vomiting. 05/15/17   Deatra Canterxford, William J, FNP  polyethylene glycol [facility](MIRALAX) packet Take 17 g by mouth 2 (two) times daily. Patient taking differently: Take 17 g by mouth as needed.  05/15/17   Deatra Canterxford, William J, FNP    Family History Family History  Problem Relation Age of Onset  . Hypertension Father   . Lung cancer Paternal Grandmother     Social History Social History   Tobacco Use  . Smoking status: Current Every Day Smoker    Packs/day: 0.00    Types: E-cigarettes  . Smokeless tobacco: Never Used  Substance Use Topics  . Alcohol use: No    Frequency: Never  . Drug use: No     Allergies   Patient has no known allergies.   Review of Systems Review of Systems  Gastrointestinal: Positive for abdominal pain and nausea.  All other systems reviewed and are negative.    Physical Exam Updated Vital Signs BP 131/86   Pulse 85   Resp 20   Ht 6' (1.829 m)   SpO2 100%   BMI 16.00 kg/m   Physical Exam Vitals signs and nursing note  reviewed.  Constitutional:      Appearance: He is well-developed.  HENT:     Head: Normocephalic and atraumatic.  Eyes:     Conjunctiva/sclera: Conjunctivae normal.     Pupils: Pupils are equal, round, and reactive to light.  Neck:     Musculoskeletal: Normal range of motion.  Cardiovascular:     Rate and Rhythm: Normal rate and regular rhythm.     Heart sounds: Normal heart sounds.  Pulmonary:     Effort: Pulmonary effort is normal.     Breath sounds: Normal breath sounds.  Abdominal:     General: Bowel sounds are normal.     Palpations: Abdomen is soft.     Comments: Distention noted along lower/left abdominal area, no solid mass noted, bladder does not feel distended  Musculoskeletal: Normal range of motion.  Skin:    General: Skin is warm and  dry.  Neurological:     Mental Status: He is alert and oriented to person, place, and time.  Psychiatric:        Mood and Affect: Mood is anxious.     Comments: Extremely anxious      ED Treatments / Results  Labs (all labs ordered are listed, but only abnormal results are displayed) Labs Reviewed  COMPREHENSIVE METABOLIC PANEL - Abnormal; Notable for the following components:      Result Value   Potassium 2.6 (*)    Glucose, Bld 132 (*)    Total Protein 6.3 (*)    All other components within normal limits  CBC - Abnormal; Notable for the following components:   WBC 11.9 (*)    All other components within normal limits  ETHANOL - Abnormal; Notable for the following components:   Alcohol, Ethyl (B) 27 (*)    All other components within normal limits  LIPASE, BLOOD  URINALYSIS, ROUTINE W REFLEX MICROSCOPIC  RAPID URINE DRUG SCREEN, HOSP PERFORMED  MAGNESIUM    EKG None  Radiology Dg Abd Acute 2+v W 1v Chest  Result Date: 06/07/2019 CLINICAL DATA:  Abdominal pain EXAM: DG ABDOMEN ACUTE W/ 1V CHEST COMPARISON:  None. FINDINGS: Colonic distention by stool and gas. No small bowel distention or evident pneumoperitoneum. No concerning mass effect or calcification. There is no edema, consolidation, effusion, or pneumothorax. Normal heart size and mediastinal contours. IMPRESSION: Distended colon by stool distally and gas proximally. Electronically Signed   By: Monte Fantasia M.D.   On: 06/07/2019 06:56    Procedures Procedures (including critical care time)  Medications Ordered in ED Medications  sodium chloride flush (NS) 0.9 % injection 3 mL (has no administration in time range)  potassium chloride 10 mEq in 100 mL IVPB (has no administration in time range)  LORazepam (ATIVAN) injection 1 mg (1 mg Intravenous Given 06/07/19 0617)  ondansetron (ZOFRAN) injection 4 mg (4 mg Intravenous Given 06/07/19 0615)     Initial Impression / Assessment and Plan / ED Course  I have  reviewed the triage vital signs and the nursing notes.  Pertinent labs & imaging results that were available during my care of the patient were reviewed by me and considered in my medical decision making (see chart for details).  28 y.o. M here with abdominal pain since 4am.  He reports feeling bloated with nausea, dry heaves, and belching.  Did have 2 beers.  Urinated last around 4:30AM, normal bowel movements recently.  He is very anxious appearing.  He does have some distention noted along lower mid/left abdomen.  Labs pending along with acute abdominal series.  Patient requested anxiety medication, ordered 1mg  ativan.  7:04 AM WBC count 11.9.  Ethanol 27.   K+ 2.6.  Magnesium will be added on.  IV K+ given.  Acute abdominal series with colonic distention-- stool distally and gas proximally.  This appears quite impressive on my review.  Will proceed with CT scan for further evaluation.  Care signed out to oncoming provider to follow-up on imaging.  Final Clinical Impressions(s) / ED Diagnoses   Final diagnoses:  Abdominal pain in male    ED Discharge Orders    None       Garlon HatchetSanders, Donatello Kleve M, PA-C 06/07/19 [id number]    Glynn Octaveancour, Stephen, MD 06/07/19 [id number]

## 2019-06-07 NOTE — ED Provider Notes (Signed)
Signout from previous provider at shift change, Sharilyn SitesLisa Sanders, PA-C See previous providers note for full H&P  Briefly, patient has been drinking and presents with abdominal pain.  He reports he feels like he needs to pass gas.  He is apparently very anxious.  Patient has hypokalemia and potassium IV has been ordered.  CT abdomen pelvis is pending considering acute abdominal series shows large amount of gas concerning for cecal volvulus.  If CTAP is negative and patient is improving, plan for discharge home.  8:42 AM patient evaluated by me after nurse asked for pain medicine for the patient.  Patient appears very anxious and uncomfortable.  He is guarding his abdomen.  He is having right and left lower quadrant tenderness. Patient has scars in his lower abdomen from recent inguinal hernia surgery in May 2020. Will give morphine prior to scan to help patient be more comfortable and lie still.  He is belching on exam.  As long as CT is negative, plan for [facility] following imaging.   Physical Exam  BP 126/79   Pulse 78   Resp 20   Ht 6' (1.829 m)   SpO2 100%   BMI 16.00 kg/m   Physical Exam Vitals signs and nursing note reviewed.  Constitutional:      General: He is not in acute distress.    Appearance: He is well-developed. He is not diaphoretic.  HENT:     Head: Normocephalic and atraumatic.     Mouth/Throat:     Pharynx: No oropharyngeal exudate.  Eyes:     General: No scleral icterus.       Right eye: No discharge.        Left eye: No discharge.     Conjunctiva/sclera: Conjunctivae normal.     Pupils: Pupils are equal, round, and reactive to light.  Neck:     Musculoskeletal: Normal range of motion and neck supple.     Thyroid: No thyromegaly.  Cardiovascular:     Rate and Rhythm: Normal rate and regular rhythm.     Heart sounds: Normal heart sounds. No murmur. No friction rub. No gallop.   Pulmonary:     Effort: Pulmonary effort is normal. No respiratory distress.     Breath  sounds: Normal breath sounds. No stridor. No wheezing or rales.  Abdominal:     General: Bowel sounds are normal. There is no distension.     Palpations: Abdomen is soft.     Tenderness: There is abdominal tenderness in the right lower quadrant, periumbilical area, suprapubic area and left lower quadrant. There is guarding. There is no rebound.     Comments: Port scars noted in the lower abdomen, well-healed  Lymphadenopathy:     Cervical: No cervical adenopathy.  Skin:    General: Skin is warm and dry.     Coloration: Skin is not pale.     Findings: No rash.  Neurological:     Mental Status: He is alert.     Coordination: Coordination normal.     ED Course/Procedures     Results for orders placed or performed during the hospital encounter of 06/07/19  SARS Coronavirus 2 (CEPHEID - Performed in [name] lab), [facility] Order   Specimen: Nasopharyngeal Swab  Result Value Ref Range   SARS Coronavirus 2 NEGATIVE NEGATIVE  Lipase, blood  Result Value Ref Range   Lipase 28 11 - 51 U/L  Comprehensive metabolic panel  Result Value Ref Range   Sodium 139 135 - 145  mmol/L   Potassium 2.6 (LL) 3.5 - 5.1 mmol/L   Chloride 101 98 - 111 mmol/L   CO2 23 22 - 32 mmol/L   Glucose, Bld 132 (H) 70 - 99 mg/dL   BUN 10 6 - 20 mg/dL   Creatinine, Ser 1.611.13 0.61 - 1.24 mg/dL   Calcium 9.4 8.9 - 09.610.3 mg/dL   Total Protein 6.3 (L) 6.5 - 8.1 g/dL   Albumin 4.5 3.5 - 5.0 g/dL   AST 20 15 - 41 U/L   ALT 13 0 - 44 U/L   Alkaline Phosphatase 81 38 - 126 U/L   Total Bilirubin 0.5 0.3 - 1.2 mg/dL   GFR calc non Af Amer >60 >60 mL/min   GFR calc Af Amer >60 >60 mL/min   Anion gap 15 5 - 15  CBC  Result Value Ref Range   WBC 11.9 (H) 4.0 - 10.5 K/uL   RBC 4.62 4.22 - 5.81 MIL/uL   Hemoglobin 14.5 13.0 - 17.0 g/dL   HCT 04.541.6 40.939.0 - 81.152.0 %   MCV 90.0 80.0 - 100.0 fL   MCH 31.4 26.0 - 34.0 pg   MCHC 34.9 30.0 - 36.0 g/dL   RDW 91.412.2 78.211.5 - 95.615.5 %   Platelets 306 150 - 400 K/uL   nRBC 0.0  0.0 - 0.2 %  Urinalysis, Routine w reflex microscopic  Result Value Ref Range   Color, Urine YELLOW YELLOW   APPearance CLEAR CLEAR   Specific Gravity, Urine 1.012 1.005 - 1.030   pH 6.0 5.0 - 8.0   Glucose, UA NEGATIVE NEGATIVE mg/dL   Hgb urine dipstick NEGATIVE NEGATIVE   Bilirubin Urine NEGATIVE NEGATIVE   Ketones, ur NEGATIVE NEGATIVE mg/dL   Protein, ur NEGATIVE NEGATIVE mg/dL   Nitrite NEGATIVE NEGATIVE   Leukocytes,Ua NEGATIVE NEGATIVE  Ethanol  Result Value Ref Range   Alcohol, Ethyl (B) 27 (H) <10 mg/dL  Rapid urine drug screen (hospital performed)  Result Value Ref Range   Opiates NONE DETECTED NONE DETECTED   Cocaine NONE DETECTED NONE DETECTED   Benzodiazepines NONE DETECTED NONE DETECTED   Amphetamines NONE DETECTED NONE DETECTED   Tetrahydrocannabinol NONE DETECTED NONE DETECTED   Barbiturates NONE DETECTED NONE DETECTED  Magnesium  Result Value Ref Range   Magnesium 2.0 1.7 - 2.4 mg/dL  Type and screen [facility]  Result Value Ref Range   ABO/RH(D) O POS    Antibody Screen NEG    Sample Expiration      06/10/2019,2359 Performed at [facility], [address] [place], [place] [address]   ABO/Rh  Result Value Ref Range   ABO/RH(D)      O POS Performed at [facility], [address] [place], [place] [address]    Ct Abdomen Pelvis W Contrast  Result Date: 06/07/2019 CLINICAL DATA:  Anterior abdominal pain since this morning. EXAM: CT ABDOMEN AND PELVIS WITH CONTRAST TECHNIQUE: Multidetector CT imaging of the abdomen and pelvis was performed using the standard protocol following bolus administration of intravenous contrast. CONTRAST:  100mL ISOVUE-300 IOPAMIDOL (ISOVUE-300) INJECTION 61% COMPARISON:  None. FINDINGS: Lower chest: No acute abnormality. Hepatobiliary: No focal liver abnormality is seen. No gallstones, gallbladder wall thickening, or biliary dilatation. Pancreas: Unremarkable. No pancreatic ductal dilatation or  surrounding inflammatory changes. Spleen: Normal in size without focal abnormality. Adrenals/Urinary Tract: Adrenal glands appear normal. LEFT renal cyst. No renal stone or hydronephrosis bilaterally. Bladder is unremarkable. Stomach/Bowel: Markedly distended sigmoid colon filled with stool  and gas. Tortuous configuration of the lower sigmoid colon suggesting sigmoid volvulus (coronal series 6, images 95 through 117; axial series 3, images 62-69). Irregular thickening of the walls of the lower sigmoid colon, at the transition zone to the relatively decompressed distal rectosigmoid colon, which could represent underlying colitis, inflammatory bowel disease, bowel ischemia or reactive thickening. More proximal colon is normal in caliber. No dilated small bowel loops seen. Stomach is unremarkable, partially decompressed. Small bowel is mostly within the RIGHT abdomen suggesting a bowel malrotation. Alternatively, the markedly distendedw segment of colon within the central abdomen and upper pelvis could be cecum with cecal volvulus. Vascular/Lymphatic: No significant vascular findings are present. No enlarged abdominal or pelvic lymph nodes. Reproductive: Prostate is unremarkable. Other: Moderate amount of free fluid in the pelvis. Additional small amount of free fluid within the RIGHT pericolic gutter and RIGHT upper quadrant. No abscess collection seen. No free intraperitoneal air seen. Musculoskeletal: No acute appearing osseous abnormality. Mild levoscoliosis which may be accentuated by patient positioning. IMPRESSION: 1. Markedly distended colon within the central abdomen and upper pelvis, filled with stool and gas, with tortuous configuration suggesting sigmoid volvulus or cecal volvulus. Cecal volvulus is a consideration given the apparent small bowel malrotation. There is irregular thickening of the walls of the bowel at and just distal to the colonic narrowing, which could represent underlying infectious,  inflammatory or ischemic process and/or reactive bowel thickening. Immediate surgical consultation recommended. 2. Moderate amount of free fluid in the pelvis. No abscess collection seen. No free intraperitoneal air seen. 3. Small bowel is mostly within the RIGHT abdomen, suggesting a bowel malrotation. 4. Remainder of the abdomen and pelvis CT is unremarkable, as detailed above. These results were called by telephone at the time of interpretation on 06/07/2019 at 11:10 am to Dr. Eliezer Mccoy , who verbally acknowledged these results. Electronically Signed   By: Franki Cabot M.D.   On: 06/07/2019 11:14   Dg Abd Acute 2+v W 1v Chest  Result Date: 06/07/2019 CLINICAL DATA:  Abdominal pain EXAM: DG ABDOMEN ACUTE W/ 1V CHEST COMPARISON:  None. FINDINGS: Colonic distention by stool and gas. No small bowel distention or evident pneumoperitoneum. No concerning mass effect or calcification. There is no edema, consolidation, effusion, or pneumothorax. Normal heart size and mediastinal contours. IMPRESSION: Distended colon by stool distally and gas proximally. Electronically Signed   By: Monte Fantasia M.D.   On: 06/07/2019 06:56    Procedures  MDM  Patient found to have sigmoid or cecal volvulus on CT.  There is also irregular thickening of the walls of the right and just distal to the colonic narrowing which could represent underlying infectious, inflammatory or ischemic process and/or reactive bowel thickening.  There is also moderate amount of felt to be reactive.  There is also an suggestive eos gel malrotation.  I discussed these findings with general surgery PA, Jackson Latino, feeling advised that patient will be taken to the OR for exploratory laparotomy and possible partial colectomy.  Patient's pain controlled with Dilaudid.  Patient's family updated by surgical team.        Frederica Kuster, PA-C 06/07/19 1602    Ezequiel Essex, MD 06/07/19 2057

## 2019-06-08 ENCOUNTER — Encounter (HOSPITAL_COMMUNITY): Payer: Self-pay | Admitting: Surgery

## 2019-06-08 LAB — BASIC METABOLIC PANEL
Anion gap: 8 (ref 5–15)
BUN: 9 mg/dL (ref 6–20)
CO2: 26 mmol/L (ref 22–32)
Calcium: 8.6 mg/dL — ABNORMAL LOW (ref 8.9–10.3)
Chloride: 103 mmol/L (ref 98–111)
Creatinine, Ser: 0.8 mg/dL (ref 0.61–1.24)
GFR calc Af Amer: >60 mL/min (ref >60)
GFR calc non Af Amer: >60 mL/min (ref >60)
Glucose, Bld: 133 mg/dL — ABNORMAL HIGH (ref 70–99)
Potassium: 4.3 mmol/L (ref 3.5–5.1)
Sodium: 137 mmol/L (ref 135–145)

## 2019-06-08 LAB — CBC
HCT: 37.1 % — ABNORMAL LOW (ref 39.0–52.0)
Hemoglobin: 12.6 g/dL — ABNORMAL LOW (ref 13.0–17.0)
MCH: 31.3 pg (ref 26.0–34.0)
MCHC: 34 g/dL (ref 30.0–36.0)
MCV: 92.3 fL (ref 80.0–100.0)
Platelets: 222 10*3/uL (ref 150–400)
RBC: 4.02 MIL/uL — ABNORMAL LOW (ref 4.22–5.81)
RDW: 12.8 % (ref 11.5–15.5)
WBC: 16.7 10*3/uL — ABNORMAL HIGH (ref 4.0–10.5)
nRBC: 0 % (ref 0.0–0.2)

## 2019-06-08 LAB — HIV ANTIBODY (ROUTINE TESTING W REFLEX): HIV Screen 4th Generation wRfx: NONREACTIVE

## 2019-06-08 MED ORDER — ACETAMINOPHEN 500 MG PO TABS
1000.0000 mg | ORAL_TABLET | Freq: Four times a day (QID) | ORAL | Status: DC
  Administered 2019-06-08 – 2019-06-15 (×26): 1000 mg via ORAL
  Filled 2019-06-08 (×27): qty 2

## 2019-06-08 MED ORDER — METHOCARBAMOL 1000 MG/10ML IJ SOLN
500.0000 mg | Freq: Three times a day (TID) | INTRAVENOUS | Status: DC
  Administered 2019-06-08 – 2019-06-10 (×8): 500 mg via INTRAVENOUS
  Filled 2019-06-08 (×3): qty 5
  Filled 2019-06-08: qty 500
  Filled 2019-06-08: qty 5
  Filled 2019-06-08 (×5): qty 500

## 2019-06-08 MED ORDER — HYDROMORPHONE HCL 1 MG/ML IJ SOLN
0.5000 mg | INTRAMUSCULAR | Status: DC | PRN
  Administered 2019-06-08 – 2019-06-10 (×12): 1 mg via INTRAVENOUS
  Administered 2019-06-10: 0.5 mg via INTRAVENOUS
  Administered 2019-06-10 – 2019-06-11 (×7): 1 mg via INTRAVENOUS
  Filled 2019-06-08 (×20): qty 1

## 2019-06-08 MED ORDER — SODIUM CHLORIDE 0.9 % IV SOLN
INTRAVENOUS | Status: DC | PRN
  Administered 2019-06-08: 250 mL via INTRAVENOUS

## 2019-06-08 MED ORDER — KETOROLAC TROMETHAMINE 15 MG/ML IJ SOLN
30.0000 mg | Freq: Three times a day (TID) | INTRAMUSCULAR | Status: AC
  Administered 2019-06-08 (×3): 30 mg via INTRAVENOUS
  Filled 2019-06-08 (×3): qty 2

## 2019-06-08 NOTE — Progress Notes (Signed)
Patient ID: Austin [name], male   DOB: 1991-04-14, 28 y.o.   MRN: [id number]    1 Day Post-Op  Subjective: Patient with a lot of pain this morning.  Taking a lot of dilaudid.  No flatus, but minimal NGT output.    Objective: Vital signs in last 24 hours: Temp:  [97.5 F (36.4 C)-99.9 F (37.7 C)] 99 F (37.2 C) (07/22 0400) Pulse Rate:  [53-102] 61 (07/22 0500) Resp:  [10-20] 10 (07/22 0500) BP: (113-140)/(66-82) 113/70 (07/22 0500) SpO2:  [95 %-100 %] 95 % (07/22 0500)    Intake/Output from previous day: 07/21 0701 - 07/22 0700 In: 3195.2 [I.V.:2545.2; IV Piggyback:650] Out: 3050 [Urine:2450; Emesis/NG output:150; Blood:100] Intake/Output this shift: No intake/output data recorded.  PE: Heart: regular Lungs: CTAB Abd: soft, appropriately tender, ND, hypoactive BS, midline incision is c/d/i with honeycomb dressing in place  Lab Results:  Recent Labs    06/07/19 0600 06/07/19 1508 06/08/19 0434  WBC 11.9*  --  16.7*  HGB 14.5 12.9* 12.6*  HCT 41.6 38.0* 37.1*  PLT 306  --  222   BMET Recent Labs    06/07/19 0600 06/07/19 1508 06/08/19 0434  NA 139 136 137  K 2.6* 4.5 4.3  CL 101  --  103  CO2 23  --  26  GLUCOSE 132*  --  133*  BUN 10  --  9  CREATININE 1.13  --  0.80  CALCIUM 9.4  --  8.6*   PT/INR No results for input(s): LABPROT, INR in the last 72 hours. CMP     Component Value Date/Time   NA 137 06/08/2019 0434   K 4.3 06/08/2019 0434   CL 103 06/08/2019 0434   CO2 26 06/08/2019 0434   GLUCOSE 133 (H) 06/08/2019 0434   BUN 9 06/08/2019 0434   CREATININE 0.80 06/08/2019 0434   CREATININE 1.08 07/06/2014 1405   CALCIUM 8.6 (L) 06/08/2019 0434   PROT 6.3 (L) 06/07/2019 0600   ALBUMIN 4.5 06/07/2019 0600   AST 20 06/07/2019 0600   ALT 13 06/07/2019 0600   ALKPHOS 81 06/07/2019 0600   BILITOT 0.5 06/07/2019 0600   GFRNONAA >60 06/08/2019 0434   GFRNONAA >89 07/06/2014 1405   GFRAA >60 06/08/2019 0434   GFRAA >89 07/06/2014 1405   Lipase     Component Value Date/Time   LIPASE 28 06/07/2019 0600       Studies/Results: Ct Abdomen Pelvis W Contrast  Result Date: 06/07/2019 CLINICAL DATA:  Anterior abdominal pain since this morning. EXAM: CT ABDOMEN AND PELVIS WITH CONTRAST TECHNIQUE: Multidetector CT imaging of the abdomen and pelvis was performed using the standard protocol following bolus administration of intravenous contrast. CONTRAST:  ISOVUE-300 IOPAMIDOL (ISOVUE-300) INJECTION 61% COMPARISON:  None. FINDINGS: Lower chest: No acute abnormality. Hepatobiliary: No focal liver abnormality is seen. No gallstones, gallbladder wall thickening, or biliary dilatation. Pancreas: Unremarkable. No pancreatic ductal dilatation or surrounding inflammatory changes. Spleen: Normal in size without focal abnormality. Adrenals/Urinary Tract: Adrenal glands appear normal. LEFT renal cyst. No renal stone or hydronephrosis bilaterally. Bladder is unremarkable. Stomach/Bowel: Markedly distended sigmoid colon filled with stool and gas. Tortuous configuration of the lower sigmoid colon suggesting sigmoid volvulus (coronal series 6, images 95 through 117; axial series 3, images 62-69). Irregular thickening of the walls of the lower sigmoid colon, at the transition zone to the relatively decompressed distal rectosigmoid colon, which could represent underlying colitis, inflammatory bowel disease, bowel ischemia or reactive thickening. More proximal colon is normal in  caliber. No dilated small bowel loops seen. Stomach is unremarkable, partially decompressed. Small bowel is mostly within the RIGHT abdomen suggesting a bowel malrotation. Alternatively, the markedly distendedw segment of colon within the central abdomen and upper pelvis could be cecum with cecal volvulus. Vascular/Lymphatic: No significant vascular findings are present. No enlarged abdominal or pelvic lymph nodes. Reproductive: Prostate is unremarkable. Other: Moderate amount of free fluid in  the pelvis. Additional small amount of free fluid within the RIGHT pericolic gutter and RIGHT upper quadrant. No abscess collection seen. No free intraperitoneal air seen. Musculoskeletal: No acute appearing osseous abnormality. Mild levoscoliosis which may be accentuated by patient positioning. IMPRESSION: 1. Markedly distended colon within the central abdomen and upper pelvis, filled with stool and gas, with tortuous configuration suggesting sigmoid volvulus or cecal volvulus. Cecal volvulus is a consideration given the apparent small bowel malrotation. There is irregular thickening of the walls of the bowel at and just distal to the colonic narrowing, which could represent underlying infectious, inflammatory or ischemic process and/or reactive bowel thickening. Immediate surgical consultation recommended. 2. Moderate amount of free fluid in the pelvis. No abscess collection seen. No free intraperitoneal air seen. 3. Small bowel is mostly within the RIGHT abdomen, suggesting a bowel malrotation. 4. Remainder of the abdomen and pelvis CT is unremarkable, as detailed above. These results were called by telephone at the time of interpretation on 06/07/2019 at 11:10 am to Dr. Buel Ream , who verbally acknowledged these results. Electronically Signed   By: Bary Richard M.D.   On: 06/07/2019 11:14   Dg Abd Acute 2+v W 1v Chest  Result Date: 06/07/2019 CLINICAL DATA:  Abdominal pain EXAM: DG ABDOMEN ACUTE W/ 1V CHEST COMPARISON:  None. FINDINGS: Colonic distention by stool and gas. No small bowel distention or evident pneumoperitoneum. No concerning mass effect or calcification. There is no edema, consolidation, effusion, or pneumothorax. Normal heart size and mediastinal contours. IMPRESSION: Distended colon by stool distally and gas proximally. Electronically Signed   By: Marnee Spring M.D.   On: 06/07/2019 06:56    Anti-infectives: Anti-infectives (From admission, onward)   Start     Dose/Rate Route  Frequency Ordered Stop   06/07/19 1515  cefoTEtan (CEFOTAN) 2 g in sodium chloride 0.9 % 100 mL IVPB     2 g 200 mL/hr over 30 Minutes Intravenous To [facility]Stay Surgical 06/07/19 1245 06/07/19 1527   06/07/19 1409  sodium chloride 0.9 % with cefoTEtan (CEFOTAN) ADS Med    Note to Pharmacy: [name]   : cabinet override      06/07/19 1409 06/07/19 1457       Assessment/Plan Malrotation Vape usage  POD 1, s/p ex lap with extended right colectomy for cecal volvulus -tx to 6N -DC foley today -mobilize and pulm toilet -pain control is somewhat of an issue currently, cont IV dilaudid, but continue scheduled tylenol, schedule robaxin and toradol.   -Dc NGT given minimal output, despite no flatus.  NPO and sips of clears from the floor and meds -no further abx therapy warranted at this time.   FEN - NPO, sips of liquids from floor, IVFs, DC NGT VTE - Lovenox ID - Cefotetan on call to OR Foley: DC 7/22 Follow up - [place]   LOS: 1 day    Letha Cape , [facility] Surgery 06/08/2019, 7:37 AM Pager: [phone]

## 2019-06-08 NOTE — Anesthesia Postprocedure Evaluation (Signed)
Anesthesia Post Note  Patient: Austin [name]  Procedure(s) Performed: EXPLORATORY LAPAROTOMY (Right Abdomen) Right Hemi Colectomy (Right Abdomen)     Patient location during evaluation: PACU Anesthesia Type: General Level of consciousness: sedated, oriented and patient cooperative Pain management: pain level controlled Vital Signs Assessment: post-procedure vital signs reviewed and stable Respiratory status: spontaneous breathing, nonlabored ventilation, respiratory function stable and patient connected to nasal cannula oxygen Cardiovascular status: blood pressure returned to baseline and stable Postop Assessment: no apparent nausea or vomiting Anesthetic complications: no    Last Vitals:  Vitals:   06/08/19 1100 06/08/19 1245  BP:  114/67  Pulse: 82 (!) 54  Resp: 11 11  Temp:  37.4 C  SpO2: 98% 100%    Last Pain:  Vitals:   06/08/19 1431  TempSrc:   PainSc: 6                  Javis Abboud,E. Jari Dipasquale

## 2019-06-08 NOTE — Progress Notes (Signed)
Mother updated

## 2019-06-08 NOTE — Progress Notes (Signed)
Orthopedic Tech Progress Note Patient Details:  Austin [name] 08/04/1991 [id number]  Ortho Devices Type of Ortho Device: Abduction pillow Ortho Device/Splint Interventions: Application       [name] 06/08/2019, 12:45 PM

## 2019-06-08 NOTE — Plan of Care (Signed)
  Problem: Clinical Measurements: Goal: Ability to maintain clinical measurements within normal limits will improve Outcome: Progressing Goal: Will remain free from infection Outcome: Progressing Goal: Respiratory complications will improve Outcome: Progressing Goal: Cardiovascular complication will be avoided Outcome: Progressing   Problem: Coping: Goal: Level of anxiety will decrease Outcome: Progressing   Problem: Pain Managment: Goal: General experience of comfort will improve Outcome: Progressing   

## 2019-06-09 LAB — CBC
HCT: 22.6 % — ABNORMAL LOW (ref 39.0–52.0)
HCT: 15.8 % — ABNORMAL LOW (ref 39.0–52.0)
Hemoglobin: 7.7 g/dL — ABNORMAL LOW (ref 13.0–17.0)
Hemoglobin: 5.4 g/dL — CL (ref 13.0–17.0)
MCH: 31.7 pg (ref 26.0–34.0)
MCH: 31.8 pg (ref 26.0–34.0)
MCHC: 34.1 g/dL (ref 30.0–36.0)
MCHC: 34.2 g/dL (ref 30.0–36.0)
MCV: 93 fL (ref 80.0–100.0)
MCV: 92.9 fL (ref 80.0–100.0)
Platelets: 301 10*3/uL (ref 150–400)
Platelets: 164 10*3/uL (ref 150–400)
RBC: 2.43 MIL/uL — ABNORMAL LOW (ref 4.22–5.81)
RBC: 1.7 MIL/uL — ABNORMAL LOW (ref 4.22–5.81)
RDW: 12.7 % (ref 11.5–15.5)
RDW: 12.8 % (ref 11.5–15.5)
WBC: 17.9 10*3/uL — ABNORMAL HIGH (ref 4.0–10.5)
WBC: 9.2 10*3/uL (ref 4.0–10.5)
nRBC: 0 % (ref 0.0–0.2)
nRBC: 0 % (ref 0.0–0.2)

## 2019-06-09 LAB — PREPARE RBC (CROSSMATCH)

## 2019-06-09 MED ORDER — SODIUM CHLORIDE 0.9% IV SOLUTION
Freq: Once | INTRAVENOUS | Status: AC
  Administered 2019-06-09: 16:00:00 via INTRAVENOUS

## 2019-06-09 MED ORDER — OXYCODONE HCL 5 MG PO TABS
5.0000 mg | ORAL_TABLET | ORAL | Status: DC | PRN
  Administered 2019-06-09: 5 mg via ORAL
  Administered 2019-06-09 – 2019-06-12 (×15): 10 mg via ORAL
  Administered 2019-06-13: 5 mg via ORAL
  Administered 2019-06-13 – 2019-06-14 (×7): 10 mg via ORAL
  Filled 2019-06-09 (×18): qty 2
  Filled 2019-06-09: qty 1
  Filled 2019-06-09 (×2): qty 2
  Filled 2019-06-09: qty 1
  Filled 2019-06-09 (×2): qty 2

## 2019-06-09 MED ORDER — KETOROLAC TROMETHAMINE 30 MG/ML IJ SOLN
30.0000 mg | Freq: Three times a day (TID) | INTRAMUSCULAR | Status: DC
  Administered 2019-06-09: 30 mg via INTRAVENOUS
  Filled 2019-06-09: qty 1

## 2019-06-09 NOTE — Progress Notes (Signed)
[facility] Progress Note  2 Days Post-Op   Assessment/Plan Malrotation Vape usage  Cecal Volvulus  POD 2, s/p ex lap with extended right colectomy, Dr. Georgette Dover, 07/21 - mobilize and pulm toilet - no further abx therapy warranted at this time. - having flatus, will advance diet - PO pain control - am labs  FEN - CLD and advance as tolerated VTE - Lovenox ID - Cefotetan on call to OR, afebrile, WBC 17.9 - monitor Foley: DC 7/22 Follow up - Tsuei   LOS: 2 days    Subjective: CC: abdominal pain  Having flatus. No N, V, fever or chills overnight. The abdominal binder is helping his pain. Soreness at rest, pain worse with movement.   Objective: Vital signs in last 24 hours: Temp:  [98.3 F (36.8 C)-99.3 F (37.4 C)] 98.3 F (36.8 C) (07/23 0508) Pulse Rate:  [54-84] 81 (07/23 0508) Resp:  [11-18] 18 (07/23 0508) BP: (112-117)/(67-79) 112/79 (07/23 0508) SpO2:  [98 %-100 %] 98 % (07/23 0508) Weight:  [49.9 kg] 49.9 kg (07/22 1122)    Intake/Output from previous day: 07/22 0701 - 07/23 0700 In: 671.9 [I.V.:610.1; IV Piggyback:61.8] Out: 900 [Urine:850; Emesis/NG output:50] Intake/Output this shift: Total I/O In: -  Out: 250 [Urine:250]  PE: Gen:  Alert, NAD, pleasant, cooperative Card:  RRR, no M/G/R heard Pulm:  CTA, no W/R/R, effort normal Abd: Soft, ND, +BS, incisions C/D/I, mild generalized TTP without guarding, no peritonitis  Skin: no rashes noted, warm and dry   Anti-infectives: Anti-infectives (From admission, onward)   Start     Dose/Rate Route Frequency Ordered Stop   06/07/19 1515  cefoTEtan (CEFOTAN) 2 g in sodium chloride 0.9 % 100 mL IVPB     2 g 200 mL/hr over 30 Minutes Intravenous To ShortStay Surgical 06/07/19 1245 06/07/19 1527   06/07/19 1409  sodium chloride 0.9 % with cefoTEtan (CEFOTAN) ADS Med    Note to Pharmacy: Henrine Screws   : cabinet override      06/07/19 1409 06/07/19 1457      Lab Results:  Recent  Labs    06/08/19 0434 06/09/19 0250  WBC 16.7* 17.9*  HGB 12.6* 7.7*  HCT 37.1* 22.6*  PLT 222 301   BMET Recent Labs    06/07/19 0600 06/07/19 1508 06/08/19 0434  NA 139 136 137  K 2.6* 4.5 4.3  CL 101  --  103  CO2 23  --  26  GLUCOSE 132*  --  133*  BUN 10  --  9  CREATININE 1.13  --  0.80  CALCIUM 9.4  --  8.6*   PT/INR No results for input(s): LABPROT, INR in the last 72 hours. CMP     Component Value Date/Time   NA 137 06/08/2019 0434   K 4.3 06/08/2019 0434   CL 103 06/08/2019 0434   CO2 26 06/08/2019 0434   GLUCOSE 133 (H) 06/08/2019 0434   BUN 9 06/08/2019 0434   CREATININE 0.80 06/08/2019 0434   CREATININE 1.08 07/06/2014 1405   CALCIUM 8.6 (L) 06/08/2019 0434   PROT 6.3 (L) 06/07/2019 0600   ALBUMIN 4.5 06/07/2019 0600   AST 20 06/07/2019 0600   ALT 13 06/07/2019 0600   ALKPHOS 81 06/07/2019 0600   BILITOT 0.5 06/07/2019 0600   GFRNONAA >60 06/08/2019 0434   GFRNONAA >89 07/06/2014 1405   GFRAA >60 06/08/2019 0434   GFRAA >89 07/06/2014 1405   Lipase     Component Value Date/Time  LIPASE 28 06/07/2019 0600    Studies/Results: Ct Abdomen Pelvis W Contrast  Result Date: 06/07/2019 CLINICAL DATA:  Anterior abdominal pain since this morning. EXAM: CT ABDOMEN AND PELVIS WITH CONTRAST TECHNIQUE: Multidetector CT imaging of the abdomen and pelvis was performed using the standard protocol following bolus administration of intravenous contrast. CONTRAST:  100mL ISOVUE-300 IOPAMIDOL (ISOVUE-300) INJECTION 61% COMPARISON:  None. FINDINGS: Lower chest: No acute abnormality. Hepatobiliary: No focal liver abnormality is seen. No gallstones, gallbladder wall thickening, or biliary dilatation. Pancreas: Unremarkable. No pancreatic ductal dilatation or surrounding inflammatory changes. Spleen: Normal in size without focal abnormality. Adrenals/Urinary Tract: Adrenal glands appear normal. LEFT renal cyst. No renal stone or hydronephrosis bilaterally. Bladder is  unremarkable. Stomach/Bowel: Markedly distended sigmoid colon filled with stool and gas. Tortuous configuration of the lower sigmoid colon suggesting sigmoid volvulus (coronal series 6, images 95 through 117; axial series 3, images 62-69). Irregular thickening of the walls of the lower sigmoid colon, at the transition zone to the relatively decompressed distal rectosigmoid colon, which could represent underlying colitis, inflammatory bowel disease, bowel ischemia or reactive thickening. More proximal colon is normal in caliber. No dilated small bowel loops seen. Stomach is unremarkable, partially decompressed. Small bowel is mostly within the RIGHT abdomen suggesting a bowel malrotation. Alternatively, the markedly distendedw segment of colon within the central abdomen and upper pelvis could be cecum with cecal volvulus. Vascular/Lymphatic: No significant vascular findings are present. No enlarged abdominal or pelvic lymph nodes. Reproductive: Prostate is unremarkable. Other: Moderate amount of free fluid in the pelvis. Additional small amount of free fluid within the RIGHT pericolic gutter and RIGHT upper quadrant. No abscess collection seen. No free intraperitoneal air seen. Musculoskeletal: No acute appearing osseous abnormality. Mild levoscoliosis which may be accentuated by patient positioning. IMPRESSION: 1. Markedly distended colon within the central abdomen and upper pelvis, filled with stool and gas, with tortuous configuration suggesting sigmoid volvulus or cecal volvulus. Cecal volvulus is a consideration given the apparent small bowel malrotation. There is irregular thickening of the walls of the bowel at and just distal to the colonic narrowing, which could represent underlying infectious, inflammatory or ischemic process and/or reactive bowel thickening. Immediate surgical consultation recommended. 2. Moderate amount of free fluid in the pelvis. No abscess collection seen. No free intraperitoneal air  seen. 3. Small bowel is mostly within the RIGHT abdomen, suggesting a bowel malrotation. 4. Remainder of the abdomen and pelvis CT is unremarkable, as detailed above. These results were called by telephone at the time of interpretation on 06/07/2019 at 11:10 am to Dr. Buel ReamALEXANDRA LAW , who verbally acknowledged these results. Electronically Signed   By: Bary RichardStan  Maynard M.D.   On: 06/07/2019 11:14      Jerre SimonJessica L  , [facility] 06/09/2019, 10:06 AM  Pager: [phone] Mon-Wed, Friday 7:00am-4:30pm Thurs 7am-11:30am  Consults: [phone]

## 2019-06-09 NOTE — Progress Notes (Signed)
Mother updated about patient status

## 2019-06-09 NOTE — Progress Notes (Signed)
CRITICAL VALUE ALERT  Critical Value:  hgb 5.4  Date & Time Notied:  06/09/2019 1430  Provider Notified:yes  Orders Received/Actions taken: see orders

## 2019-06-10 LAB — PREPARE RBC (CROSSMATCH)

## 2019-06-10 LAB — CBC
HCT: 20.6 % — ABNORMAL LOW (ref 39.0–52.0)
HCT: 27.3 % — ABNORMAL LOW (ref 39.0–52.0)
Hemoglobin: 7 g/dL — ABNORMAL LOW (ref 13.0–17.0)
Hemoglobin: 9.3 g/dL — ABNORMAL LOW (ref 13.0–17.0)
MCH: 30.7 pg (ref 26.0–34.0)
MCH: 31.2 pg (ref 26.0–34.0)
MCHC: 34 g/dL (ref 30.0–36.0)
MCHC: 34.1 g/dL (ref 30.0–36.0)
MCV: 90.4 fL (ref 80.0–100.0)
MCV: 91.6 fL (ref 80.0–100.0)
Platelets: 143 10*3/uL — ABNORMAL LOW (ref 150–400)
Platelets: 175 10*3/uL (ref 150–400)
RBC: 2.28 MIL/uL — ABNORMAL LOW (ref 4.22–5.81)
RBC: 2.98 MIL/uL — ABNORMAL LOW (ref 4.22–5.81)
RDW: 13.7 % (ref 11.5–15.5)
RDW: 13.7 % (ref 11.5–15.5)
WBC: 7.3 10*3/uL (ref 4.0–10.5)
WBC: 7.4 10*3/uL (ref 4.0–10.5)
nRBC: 0 % (ref 0.0–0.2)
nRBC: 0 % (ref 0.0–0.2)

## 2019-06-10 LAB — BASIC METABOLIC PANEL
Anion gap: 6 (ref 5–15)
BUN: 15 mg/dL (ref 6–20)
CO2: 25 mmol/L (ref 22–32)
Calcium: 7.9 mg/dL — ABNORMAL LOW (ref 8.9–10.3)
Chloride: 105 mmol/L (ref 98–111)
Creatinine, Ser: 0.85 mg/dL (ref 0.61–1.24)
GFR calc Af Amer: >60 mL/min (ref >60)
GFR calc non Af Amer: >60 mL/min (ref >60)
Glucose, Bld: 100 mg/dL — ABNORMAL HIGH (ref 70–99)
Potassium: 4.2 mmol/L (ref 3.5–5.1)
Sodium: 136 mmol/L (ref 135–145)

## 2019-06-10 MED ORDER — ALUM & MAG HYDROXIDE-SIMETH 200-200-20 MG/5ML PO SUSP
30.0000 mL | ORAL | Status: DC | PRN
  Administered 2019-06-10 – 2019-06-15 (×8): 30 mL via ORAL
  Filled 2019-06-10 (×8): qty 30

## 2019-06-10 MED ORDER — ADULT MULTIVITAMIN W/MINERALS CH
1.0000 | ORAL_TABLET | Freq: Every day | ORAL | Status: DC
  Administered 2019-06-10 – 2019-06-14 (×5): 1 via ORAL
  Filled 2019-06-10 (×5): qty 1

## 2019-06-10 MED ORDER — KETOROLAC TROMETHAMINE 15 MG/ML IJ SOLN
15.0000 mg | Freq: Four times a day (QID) | INTRAMUSCULAR | Status: DC | PRN
  Administered 2019-06-10 – 2019-06-12 (×9): 15 mg via INTRAVENOUS
  Filled 2019-06-10 (×9): qty 1

## 2019-06-10 MED ORDER — ALPRAZOLAM 0.5 MG PO TABS
1.0000 mg | ORAL_TABLET | Freq: Three times a day (TID) | ORAL | Status: DC | PRN
  Administered 2019-06-10 – 2019-06-12 (×5): 1 mg via ORAL
  Filled 2019-06-10 (×5): qty 2

## 2019-06-10 MED ORDER — BOOST / RESOURCE BREEZE PO LIQD CUSTOM
1.0000 | Freq: Three times a day (TID) | ORAL | Status: DC
  Administered 2019-06-10 – 2019-06-13 (×7): 1 via ORAL

## 2019-06-10 MED ORDER — METHOCARBAMOL 1000 MG/10ML IJ SOLN
500.0000 mg | Freq: Four times a day (QID) | INTRAVENOUS | Status: DC
  Administered 2019-06-10 – 2019-06-13 (×11): 500 mg via INTRAVENOUS
  Filled 2019-06-10 (×3): qty 5
  Filled 2019-06-10 (×2): qty 500
  Filled 2019-06-10 (×11): qty 5

## 2019-06-10 MED ORDER — SODIUM CHLORIDE 0.9% IV SOLUTION: Freq: Once | INTRAVENOUS | Status: DC

## 2019-06-10 MED ORDER — GABAPENTIN 300 MG PO CAPS
300.0000 mg | ORAL_CAPSULE | Freq: Three times a day (TID) | ORAL | Status: DC
  Administered 2019-06-10 – 2019-06-12 (×7): 300 mg via ORAL
  Filled 2019-06-10 (×7): qty 1

## 2019-06-10 NOTE — Progress Notes (Addendum)
Initial Nutrition Assessment  DOCUMENTATION CODES:   Severe malnutrition in context of social or environmental circumstances, Underweight  INTERVENTION:   -Boost Breeze po TID, each supplement provides 250 kcal and 9 grams of protein -MVI with minerals daily -RD will follow for diet advancement and adjust supplement regimen as appropriate  NUTRITION DIAGNOSIS:   Severe Malnutrition related to social / environmental circumstances as evidenced by moderate fat depletion, severe fat depletion, moderate muscle depletion, severe muscle depletion.  GOAL:   Patient will meet greater than or equal to 90% of their needs  MONITOR:   PO intake, Supplement acceptance, Diet advancement, Labs, Weight trends, Skin, I & O's  REASON FOR ASSESSMENT:   Malnutrition Screening Tool    ASSESSMENT:   Pt is an otherwise healthy 28 yo male with a hx of BL laparoscopy inguinal hernia repairs early this year by Dr. Michaell CowingGross who presented to the ED with complaints of abdominal pain. Pt states sudden onset of pain after eating last night. Pain is generalized, severe, constant with associated bloating and nausea. Pt denies vomiting. He states he has never had this type of pain before. He states some hx of constipation but has been having regular BM's recently. His last BM was yesterday. He denies urinary symptoms, cough, SOB, CP, illicit drug use, health issues, medication allergies. Pt states he drinks 2-3 beers a week. He would like us to call his mother with the plan.  Pt admitted with cecal volvulus ischemic right colon and terminal ileum; malrotation  7/21- NGT placed, s/p Procedure performed: Exploratory laparotomy with right hemicolectomy 7/22- NGT d/c  Reviewed I/O's: +2.2 L x 24 hours and +2.2 L since admission  UOP: 550 ml x 24 hours  Case discussed with RN, who reports that pt is tolerating clear liquid diet well. He is currently receiving a blood transfusion.   Spoke with pt at bedside, who was  groggy but conversant. He reports tolerating clear liquids, but is not interested in diet advancement ("I don't want to push it"). Pt reports fair appetite PTA, reporting he was eating a lot of junk foods over the past 2 weeks secondary to his refrigerator being broken. He endorses poor diet quality, eating mostly fast food during this time. Pt also shares decreased appetite since inguinal hernia surgery approximately 2 months ago, when he consumed only one meal per day (such as a hot dog)- pt endorses this is likely due to not having access to adderall during this time. Pt endorses a 10# wt loss during this time. Per pt, he has always been slender- UBW is around 117# and "has a high metabolism".   Discussed components of clear liquid diet and possibility for diet advancement. Pt amenable to Boost Breeze and Ensure supplements once diet is advanced. Also provided pt with mango italian ice per his request.   Labs reviewed.   NUTRITION - FOCUSED PHYSICAL EXAM:    Most Recent Value  Orbital Region  Moderate depletion  Upper Arm Region  Severe depletion  Thoracic and Lumbar Region  Severe depletion  Buccal Region  Moderate depletion  Temple Region  Severe depletion  Clavicle Bone Region  Severe depletion  Clavicle and Acromion Bone Region  Severe depletion  Scapular Bone Region  Severe depletion  Dorsal Hand  Moderate depletion  Patellar Region  Severe depletion  Anterior Thigh Region  Severe depletion  Posterior Calf Region  Severe depletion  Edema (RD Assessment)  None  Hair  Reviewed  Eyes  Reviewed  Mouth  Reviewed  Skin  Reviewed  Nails  Reviewed       Diet Order:   Diet Order            Diet clear liquid Room service appropriate? Yes; Fluid consistency: Thin  Diet effective now              EDUCATION NEEDS:   Education needs have been addressed  Skin:  Skin Assessment: Skin Integrity Issues: Skin Integrity Issues:: Incisions Incisions: closed abdomen  Last BM:   Unknown  Height:   Ht Readings from Last 1 Encounters:  06/08/19 5\' 11"  (1.803 m)    Weight:   Wt Readings from Last 1 Encounters:  06/08/19 49.9 kg    Ideal Body Weight:  78.2 kg  BMI:  Body mass index is 15.34 kg/m.  Estimated Nutritional Needs:   Kcal:  1800-2000  Protein:  100-115 grams  Fluid:  > 1.8 L    Tesla Bochicchio A. Jimmye Norman, RD, LDN, [employer] Registered Dietitian II Certified Diabetes Care and Education Specialist Pager: [phone] After hours Pager: [phone]

## 2019-06-10 NOTE — Plan of Care (Signed)
  Problem: Safety: Goal: Ability to remain free from injury will improve Outcome: Progressing   Problem: Skin Integrity: Goal: Risk for impaired skin integrity will decrease Outcome: Progressing   

## 2019-06-10 NOTE — Progress Notes (Signed)
Patient c/o feeling full/bloated after drinking Breeze. Patient decided to remain on clears for dinner. PRN zofran administered.

## 2019-06-10 NOTE — Progress Notes (Signed)
[facility] Progress Note  3 Days Post-Op   Assessment/Plan Malrotation Vape usage  Cecal Volvulus  POD 3, s/p ex lap with extended right colectomy, Dr. Corliss Skainssuei, 07/21 - mobilize and pulm toilet - no further abx therapy warranted at this time. - having flatus, will advance diet - PO pain control - am labs ABLA - Hgb 5.4 07/23 afternoon, got 2 U - Hgb today 7.0, ordered 1 U, recheck this afternoon  FEN -CLD and advance as tolerated VTE -Lovenox ID -Cefotetan on call to OR, afebrile, WBC 7.3 Foley: DC 7/22 Follow up - Tsuei  POC, mom, Austin LacyRenee [name], [phone]) spoke to her this am    LOS: 3 days    Subjective: CC: abdominal pain  Wants water. He denies nausea or vomiting. He is still having flatus and some belching. No fever or chills. He was weak and lightheaded yesterday so he hasn't walked. He wants to walk. I informed him to get the blood first then try walking this afternoon.   Objective: Vital signs in last 24 hours: Temp:  [98.6 F (37 C)-99.6 F (37.6 C)] 99.3 F (37.4 C) (07/23 2256) Pulse Rate:  [83-102] 101 (07/23 2256) Resp:  [18] 18 (07/23 2256) BP: (109-117)/(56-65) 110/60 (07/23 2256) SpO2:  [95 %-100 %] 100 % (07/23 2256)    Intake/Output from previous day: 07/23 0701 - 07/24 0700 In: 2797 [I.V.:2797] Out: 550 [Urine:550] Intake/Output this shift: No intake/output data recorded.  PE: Gen:  Alert, NAD, pleasant, cooperative Card:  RRR, no M/G/R heard Pulm:  CTA, no W/R/R, effort normal Abd: Soft, ND, +BS, incisions C/D/I, mild generalized TTP without guarding, no peritonitis  Skin: no rashes noted, warm and dry   Anti-infectives: Anti-infectives (From admission, onward)   Start     Dose/Rate Route Frequency Ordered Stop   06/07/19 1515  cefoTEtan (CEFOTAN) 2 g in sodium chloride 0.9 % 100 mL IVPB     2 g 200 mL/hr over 30 Minutes Intravenous To ShortStay Surgical 06/07/19 1245 06/07/19 1527   06/07/19 1409   sodium chloride 0.9 % with cefoTEtan (CEFOTAN) ADS Med    Note to Pharmacy: Shireen Quanodd, [name]   : cabinet override      06/07/19 1409 06/07/19 1457      Lab Results:  Recent Labs    06/09/19 1439 06/10/19 0348  WBC 9.2 7.3  HGB 5.4* 7.0*  HCT 15.8* 20.6*  PLT 164 143*   BMET Recent Labs    06/08/19 0434 06/10/19 0348  NA 137 136  K 4.3 4.2  CL 103 105  CO2 26 25  GLUCOSE 133* 100*  BUN 9 15  CREATININE 0.80 0.85  CALCIUM 8.6* 7.9*   PT/INR No results for input(s): LABPROT, INR in the last 72 hours. CMP     Component Value Date/Time   NA 136 06/10/2019 0348   K 4.2 06/10/2019 0348   CL 105 06/10/2019 0348   CO2 25 06/10/2019 0348   GLUCOSE 100 (H) 06/10/2019 0348   BUN 15 06/10/2019 0348   CREATININE 0.85 06/10/2019 0348   CREATININE 1.08 07/06/2014 1405   CALCIUM 7.9 (L) 06/10/2019 0348   PROT 6.3 (L) 06/07/2019 0600   ALBUMIN 4.5 06/07/2019 0600   AST 20 06/07/2019 0600   ALT 13 06/07/2019 0600   ALKPHOS 81 06/07/2019 0600   BILITOT 0.5 06/07/2019 0600   GFRNONAA >60 06/10/2019 0348   GFRNONAA >89 07/06/2014 1405   GFRAA >60 06/10/2019 0348   GFRAA >89 07/06/2014 1405  Lipase     Component Value Date/Time   LIPASE 28 06/07/2019 0600    Studies/Results: No results found.    Kalman Drape , [facility] 06/10/2019, 9:21 AM  Pager: [phone] Mon-Wed, Friday 7:00am-4:30pm Thurs 7am-11:30am  Consults: [phone]

## 2019-06-10 NOTE — Progress Notes (Signed)
Paged Dr. Kae Heller d/t patient states he is miserable and in pain. Pain is on his right flank and is c/o indigestion. PRN Maalox ordered and administered along with PRN OXY.   Patient c/o pain throughout the day and would call within 40 minutes of being given pain medication stating he was in 10/10 pain. Patient has ambulated x3 around the unit today. Report given to night shift RN.

## 2019-06-11 LAB — BASIC METABOLIC PANEL
Anion gap: 7 (ref 5–15)
BUN: 13 mg/dL (ref 6–20)
CO2: 26 mmol/L (ref 22–32)
Calcium: 8.3 mg/dL — ABNORMAL LOW (ref 8.9–10.3)
Chloride: 103 mmol/L (ref 98–111)
Creatinine, Ser: 0.78 mg/dL (ref 0.61–1.24)
GFR calc Af Amer: >60 mL/min (ref >60)
GFR calc non Af Amer: >60 mL/min (ref >60)
Glucose, Bld: 131 mg/dL — ABNORMAL HIGH (ref 70–99)
Potassium: 4.3 mmol/L (ref 3.5–5.1)
Sodium: 136 mmol/L (ref 135–145)

## 2019-06-11 LAB — BPAM RBC
Blood Product Expiration Date: 202008202359
Blood Product Expiration Date: 202008202359
Blood Product Expiration Date: 202008202359
ISSUE DATE / TIME: 202007231557
ISSUE DATE / TIME: 202007232012
ISSUE DATE / TIME: 202007241006
Unit Type and Rh: 5100
Unit Type and Rh: 5100
Unit Type and Rh: 5100

## 2019-06-11 LAB — TYPE AND SCREEN
ABO/RH(D): O POS
Antibody Screen: NEGATIVE
Unit division: 0
Unit division: 0
Unit division: 0

## 2019-06-11 LAB — MAGNESIUM: Magnesium: 2 mg/dL (ref 1.7–2.4)

## 2019-06-11 LAB — CBC
HCT: 28.5 % — ABNORMAL LOW (ref 39.0–52.0)
Hemoglobin: 9.8 g/dL — ABNORMAL LOW (ref 13.0–17.0)
MCH: 31.1 pg (ref 26.0–34.0)
MCHC: 34.4 g/dL (ref 30.0–36.0)
MCV: 90.5 fL (ref 80.0–100.0)
Platelets: 224 10*3/uL (ref 150–400)
RBC: 3.15 MIL/uL — ABNORMAL LOW (ref 4.22–5.81)
RDW: 13.8 % (ref 11.5–15.5)
WBC: 7.3 10*3/uL (ref 4.0–10.5)
nRBC: 0 % (ref 0.0–0.2)

## 2019-06-11 MED ORDER — ACETAMINOPHEN 500 MG PO TABS: 1000.0000 mg | ORAL_TABLET | Freq: Three times a day (TID) | ORAL | Status: DC

## 2019-06-11 MED ORDER — HYDROMORPHONE HCL 1 MG/ML IJ SOLN
0.5000 mg | INTRAMUSCULAR | Status: DC | PRN
  Administered 2019-06-11 – 2019-06-14 (×16): 1 mg via INTRAVENOUS
  Filled 2019-06-11 (×17): qty 1

## 2019-06-11 NOTE — Plan of Care (Signed)
  Problem: Pain Managment: Goal: General experience of comfort will improve Outcome: Progressing   

## 2019-06-11 NOTE — Progress Notes (Signed)
Patient ID: Austin [name], male   DOB: 05-02-1991, 28 y.o.   MRN: [id number] [facility] Progress Note:   4 Days Post-Op  Subjective: Mental status is clear;   Objective: Vital signs in last 24 hours: Temp:  [98.2 F (36.8 C)-99.2 F (37.3 C)] 98.2 F (36.8 C) (07/25 0431) Pulse Rate:  [62-96] 62 (07/25 0431) Resp:  [16-18] 16 (07/25 0431) BP: (116-132)/(71-80) 130/79 (07/25 0431) SpO2:  [98 %-100 %] 98 % (07/25 0431)  Intake/Output from previous day: 07/24 0701 - 07/25 0700 In: 2503 [P.O.:918; I.V.:1140; Blood:345; IV Piggyback:100] Out: 4696 [Urine:1175] Intake/Output this shift: No intake/output data recorded.  Physical Exam: Work of breathing is not labored;  Having some flatus and also burping with reflux.  Will try to reduce the frequency of Dilaudid.    Lab Results:  Results for orders placed or performed during the hospital encounter of 06/07/19 (from the past 48 hour(s))  CBC     Status: Abnormal   Collection Time: 06/09/19  2:39 PM  Result Value Ref Range   WBC 9.2 4.0 - 10.5 K/uL   RBC 1.70 (L) 4.22 - 5.81 MIL/uL   Hemoglobin 5.4 (LL) 13.0 - 17.0 g/dL    Comment: REPEATED TO VERIFY THIS CRITICAL RESULT HAS VERIFIED AND BEEN CALLED TO [name] RN BY [name] ON 07 23 2020 AT 1458, AND HAS BEEN READ BACK.     HCT 15.8 (L) 39.0 - 52.0 %   MCV 92.9 80.0 - 100.0 fL   MCH 31.8 26.0 - 34.0 pg   MCHC 34.2 30.0 - 36.0 g/dL   RDW 12.8 11.5 - 15.5 %   Platelets 164 150 - 400 K/uL   nRBC 0.0 0.0 - 0.2 %    Comment: Performed at [facility], [place] [address] [place], [place] [address]  Prepare RBC     Status: None   Collection Time: 06/09/19  3:11 PM  Result Value Ref Range   Order Confirmation      ORDER PROCESSED BY BLOOD BANK Performed at [facility], [place] [address] [place], [place] [address]   CBC     Status: Abnormal   Collection Time: 06/10/19  3:48 AM  Result Value Ref Range   WBC 7.3 4.0 - 10.5 K/uL   RBC 2.28 (L) 4.22 - 5.81  MIL/uL   Hemoglobin 7.0 (L) 13.0 - 17.0 g/dL    Comment: REPEATED TO VERIFY POST TRANSFUSION SPECIMEN    HCT 20.6 (L) 39.0 - 52.0 %   MCV 90.4 80.0 - 100.0 fL    Comment: POST TRANSFUSION SPECIMEN   MCH 30.7 26.0 - 34.0 pg   MCHC 34.0 30.0 - 36.0 g/dL   RDW 13.7 11.5 - 15.5 %   Platelets 143 (L) 150 - 400 K/uL   nRBC 0.0 0.0 - 0.2 %    Comment: Performed at [facility], [place] [address], [place], [place] [address]  Basic metabolic panel     Status: Abnormal   Collection Time: 06/10/19  3:48 AM  Result Value Ref Range   Sodium 136 135 - 145 mmol/L   Potassium 4.2 3.5 - 5.1 mmol/L   Chloride 105 98 - 111 mmol/L   CO2 25 22 - 32 mmol/L   Glucose, Bld 100 (H) 70 - 99 mg/dL   BUN 15 6 - 20 mg/dL   Creatinine, Ser 0.85 0.61 - 1.24 mg/dL   Calcium 7.9 (L) 8.9 - 10.3 mg/dL   GFR calc  non Af Amer >60 >60 mL/min   GFR calc Af Amer >60 >60 mL/min   Anion gap 6 5 - 15    Comment: Performed at [facility], [address] [place], [place] [address]  Prepare RBC     Status: None   Collection Time: 06/10/19  8:57 AM  Result Value Ref Range   Order Confirmation      ORDER PROCESSED BY BLOOD BANK Performed at [facility], [address] [place], [place] [address]   CBC     Status: Abnormal   Collection Time: 06/10/19  4:09 PM  Result Value Ref Range   WBC 7.4 4.0 - 10.5 K/uL   RBC 2.98 (L) 4.22 - 5.81 MIL/uL   Hemoglobin 9.3 (L) 13.0 - 17.0 g/dL    Comment: REPEATED TO VERIFY POST TRANSFUSION SPECIMEN    HCT 27.3 (L) 39.0 - 52.0 %   MCV 91.6 80.0 - 100.0 fL   MCH 31.2 26.0 - 34.0 pg   MCHC 34.1 30.0 - 36.0 g/dL   RDW 09.813.7 11.911.5 - 14.715.5 %   Platelets 175 150 - 400 K/uL   nRBC 0.0 0.0 - 0.2 %    Comment: Performed at [facility], [address] [place], [place] [address]  Basic metabolic panel     Status: Abnormal   Collection Time: 06/11/19  3:11 AM  Result Value Ref Range   Sodium 136 135 - 145 mmol/L   Potassium 4.3 3.5 - 5.1 mmol/L   Chloride 103  98 - 111 mmol/L   CO2 26 22 - 32 mmol/L   Glucose, Bld 131 (H) 70 - 99 mg/dL   BUN 13 6 - 20 mg/dL   Creatinine, Ser 2.130.78 0.61 - 1.24 mg/dL   Calcium 8.3 (L) 8.9 - 10.3 mg/dL   GFR calc non Af Amer >60 >60 mL/min   GFR calc Af Amer >60 >60 mL/min   Anion gap 7 5 - 15    Comment: Performed at [facility], [address] [place], [place] [address]  CBC     Status: Abnormal   Collection Time: 06/11/19  3:11 AM  Result Value Ref Range   WBC 7.3 4.0 - 10.5 K/uL   RBC 3.15 (L) 4.22 - 5.81 MIL/uL   Hemoglobin 9.8 (L) 13.0 - 17.0 g/dL   HCT 84.628.5 (L) 96.239.0 - 95.252.0 %   MCV 90.5 80.0 - 100.0 fL   MCH 31.1 26.0 - 34.0 pg   MCHC 34.4 30.0 - 36.0 g/dL   RDW 84.113.8 32.411.5 - 40.115.5 %   Platelets 224 150 - 400 K/uL   nRBC 0.0 0.0 - 0.2 %    Comment: Performed at [facility], [address] [place], [place] [address]  Magnesium     Status: None   Collection Time: 06/11/19  3:11 AM  Result Value Ref Range   Magnesium 2.0 1.7 - 2.4 mg/dL    Comment: Performed at [name], [address] [place], [place] [address]    Radiology/Results: No results found.  Anti-infectives: Anti-infectives (From admission, onward)   Start     Dose/Rate Route Frequency Ordered Stop   06/07/19 1515  cefoTEtan (CEFOTAN) 2 g in sodium chloride 0.9 % 100 mL IVPB     2 g 200 mL/hr over 30 Minutes Intravenous To ShortStay Surgical 06/07/19 1245 06/07/19 1527   06/07/19 1409  sodium chloride 0.9 % with cefoTEtan (CEFOTAN) ADS Med  Note to Pharmacy: Shireen Quanodd, [name]   : cabinet override      06/07/19 1409 06/07/19 1457      Assessment/Plan: Problem List: Patient Active Problem List   Diagnosis Date Noted  . Volvulus (HCC) 06/07/2019  . BMI less than 19,adult 05/30/2018  . Anxiety 12/09/2017  . Right inguinal hernia 12/09/2017  . ADD (attention deficit disorder)     Slow progress in recovery from colectomy 4 Days Post-Op    LOS: 4 days   Matt B. Daphine DeutscherMartin, MD, [facility]  [facility] [phone] beeper [phone]  06/11/2019 9:03 AM

## 2019-06-11 NOTE — Progress Notes (Signed)
Patient continiues to c/o pain despite IV pain med given.r Redmond Pulling notified with new order.

## 2019-06-12 MED ORDER — ALPRAZOLAM 0.5 MG PO TABS
1.0000 mg | ORAL_TABLET | Freq: Three times a day (TID) | ORAL | Status: DC | PRN
  Administered 2019-06-12 – 2019-06-14 (×5): 1 mg via ORAL
  Filled 2019-06-12 (×6): qty 2

## 2019-06-12 NOTE — Progress Notes (Signed)
Patient ID: Austin [name], male   DOB: 06/28/1991, 28 y.o.   MRN: [id number] [facility] Progress Note:   5 Days Post-Op  Subjective: Mental status is alert and appropriate Objective: Vital signs in last 24 hours: Temp:  [97.7 F (36.5 C)-98.8 F (37.1 C)] 97.7 F (36.5 C) (07/26 0507) Pulse Rate:  [69-73] 69 (07/26 0507) Resp:  [18-20] 18 (07/26 0507) BP: (115-135)/(66-86) 123/82 (07/26 0507) SpO2:  [99 %-100 %] 99 % (07/26 0507)  Intake/Output from previous day: 07/25 0701 - 07/26 0700 In: 560 [P.O.:560] Out: 800 [Urine:800] Intake/Output this shift: No intake/output data recorded.  Physical Exam: Work of breathing is normal;  Abdomen hurts when he gets distended.  Passing flatus more and + BM  Lab Results:  Results for orders placed or performed during the hospital encounter of 06/07/19 (from the past 48 hour(s))  CBC     Status: Abnormal   Collection Time: 06/10/19  4:09 PM  Result Value Ref Range   WBC 7.4 4.0 - 10.5 K/uL   RBC 2.98 (L) 4.22 - 5.81 MIL/uL   Hemoglobin 9.3 (L) 13.0 - 17.0 g/dL    Comment: REPEATED TO VERIFY POST TRANSFUSION SPECIMEN    HCT 27.3 (L) 39.0 - 52.0 %   MCV 91.6 80.0 - 100.0 fL   MCH 31.2 26.0 - 34.0 pg   MCHC 34.1 30.0 - 36.0 g/dL   RDW 96.213.7 95.211.5 - 84.115.5 %   Platelets 175 150 - 400 K/uL   nRBC 0.0 0.0 - 0.2 %    Comment: Performed at [facility], [address] [place], [place] [address]  Basic metabolic panel     Status: Abnormal   Collection Time: 06/11/19  3:11 AM  Result Value Ref Range   Sodium 136 135 - 145 mmol/L   Potassium 4.3 3.5 - 5.1 mmol/L   Chloride 103 98 - 111 mmol/L   CO2 26 22 - 32 mmol/L   Glucose, Bld 131 (H) 70 - 99 mg/dL   BUN 13 6 - 20 mg/dL   Creatinine, Ser 1.020.78 0.61 - 1.24 mg/dL   Calcium 8.3 (L) 8.9 - 10.3 mg/dL   GFR calc non Af Amer >60 >60 mL/min   GFR calc Af Amer >60 >60 mL/min   Anion gap 7 5 - 15    Comment: Performed at [facility], [address] [place], [place]  [address]  CBC     Status: Abnormal   Collection Time: 06/11/19  3:11 AM  Result Value Ref Range   WBC 7.3 4.0 - 10.5 K/uL   RBC 3.15 (L) 4.22 - 5.81 MIL/uL   Hemoglobin 9.8 (L) 13.0 - 17.0 g/dL   HCT 64.428.5 (L) 03.439.0 - 74.252.0 %   MCV 90.5 80.0 - 100.0 fL   MCH 31.1 26.0 - 34.0 pg   MCHC 34.4 30.0 - 36.0 g/dL   RDW 59.513.8 63.811.5 - 75.615.5 %   Platelets 224 150 - 400 K/uL   nRBC 0.0 0.0 - 0.2 %    Comment: Performed at [facility], [address] [place], [place] [address]  Magnesium     Status: None   Collection Time: 06/11/19  3:11 AM  Result Value Ref Range   Magnesium 2.0 1.7 - 2.4 mg/dL    Comment: Performed at [facility], [address] [place], [place] [address]    Radiology/Results: No results found.  Anti-infectives: Anti-infectives (From admission, onward)   Start  Dose/Rate Route Frequency Ordered Stop   06/07/19 1515  cefoTEtan (CEFOTAN) 2 g in sodium chloride 0.9 % 100 mL IVPB     2 g 200 mL/hr over 30 Minutes Intravenous To ShortStay Surgical 06/07/19 1245 06/07/19 1527   06/07/19 1409  sodium chloride 0.9 % with cefoTEtan (CEFOTAN) ADS Med    Note to Pharmacy: Henrine Screws   : cabinet override      06/07/19 1409 06/07/19 1457      Assessment/Plan: Problem List: Patient Active Problem List   Diagnosis Date Noted  . Volvulus ([place]) 06/07/2019  . BMI less than 19,adult 05/30/2018  . Anxiety 12/09/2017  . Right inguinal hernia 12/09/2017  . ADD (attention deficit disorder)     Will advance diet to soft.   5 Days Post-Op    LOS: 5 days   Matt B. Hassell Done, MD, [facility] [phone] beeper [phone]  06/12/2019 9:41 AM

## 2019-06-12 NOTE — Plan of Care (Signed)
  Problem: Pain Managment: Goal: General experience of comfort will improve Outcome: Progressing   

## 2019-06-13 DIAGNOSIS — E43 Unspecified severe protein-calorie malnutrition: Secondary | ICD-10-CM | POA: Insufficient documentation

## 2019-06-13 MED ORDER — IBUPROFEN 600 MG PO TABS: 600.0000 mg | ORAL_TABLET | Freq: Four times a day (QID) | ORAL | Status: DC | PRN

## 2019-06-13 MED ORDER — AMPHETAMINE-DEXTROAMPHETAMINE 10 MG PO TABS
20.0000 mg | ORAL_TABLET | Freq: Every day | ORAL | Status: DC
  Administered 2019-06-13 – 2019-06-14 (×2): 20 mg via ORAL
  Filled 2019-06-13 (×2): qty 2

## 2019-06-13 MED ORDER — METHOCARBAMOL 750 MG PO TABS
750.0000 mg | ORAL_TABLET | Freq: Three times a day (TID) | ORAL | Status: DC
  Administered 2019-06-13 – 2019-06-14 (×3): 750 mg via ORAL
  Filled 2019-06-13 (×5): qty 1

## 2019-06-13 MED ORDER — ENSURE ENLIVE PO LIQD
237.0000 mL | Freq: Two times a day (BID) | ORAL | Status: DC
  Administered 2019-06-13 – 2019-06-14 (×3): 237 mL via ORAL

## 2019-06-13 MED ORDER — FAMOTIDINE 20 MG PO TABS
20.0000 mg | ORAL_TABLET | Freq: Every day | ORAL | Status: DC
  Administered 2019-06-13: 20 mg via ORAL
  Filled 2019-06-13: qty 1

## 2019-06-13 MED ORDER — ZOLPIDEM TARTRATE 5 MG PO TABS
5.0000 mg | ORAL_TABLET | Freq: Once | ORAL | Status: AC
  Administered 2019-06-13: 5 mg via ORAL
  Filled 2019-06-13: qty 1

## 2019-06-13 NOTE — Progress Notes (Addendum)
6 Days Post-Op    CC:  Subjective:   Objective: Vital signs in last 24 hours: Temp:  [99.1 F (37.3 C)-99.7 F (37.6 C)] 99.4 F (37.4 C) (07/27 0624) Pulse Rate:  [64-82] 82 (07/27 0624) Resp:  [17-18] 17 (07/27 0624) BP: (118-135)/(75-79) 135/75 (07/27 0624) SpO2:  [97 %-98 %] 97 % (07/27 0624) Last BM Date: 06/13/19 600 p.o. Urine x 5 Afebrile T-max 99.7 vital signs are stable. Potassium 4.3, WBC 7.3 H&H is stable  Intake/Output from previous day: 07/26 0701 - 07/27 0700 In: 600 [P.O.:600] Out: -  Intake/Output this shift: No intake/output data recorded.  General appearance: alert, cooperative and no distress Resp: clear to auscultation bilaterally GI: Soft, sore, still taking a lot of pain medications.  Having loose stools.  Tolerating soft diet.  Midline incision looks fine dressing still in place.  Lab Results:  Recent Labs    06/10/19 1609 06/11/19 0311  WBC 7.4 7.3  HGB 9.3* 9.8*  HCT 27.3* 28.5*  PLT 175 224    BMET Recent Labs    06/11/19 0311  NA 136  K 4.3  CL 103  CO2 26  GLUCOSE 131*  BUN 13  CREATININE 0.78  CALCIUM 8.3*   PT/INR No results for input(s): LABPROT, INR in the last 72 hours.  Recent Labs  Lab 06/07/19 0600  AST 20  ALT 13  ALKPHOS 81  BILITOT 0.5  PROT 6.3*  ALBUMIN 4.5     Lipase     Component Value Date/Time   LIPASE 28 06/07/2019 0600     Medications: . sodium chloride   Intravenous Once  . acetaminophen  1,000 mg Oral Q6H  . Chlorhexidine Gluconate Cloth  6 each Topical Daily  . feeding supplement  1 Container Oral TID BM  . gabapentin  300 mg Oral TID  . multivitamin with minerals  1 tablet Oral Daily  . pantoprazole (PROTONIX) IV  40 mg Intravenous QHS   . sodium chloride 10 mL/hr at 06/08/19 1100  . 0.9 % NaCl with KCl 40 mEq / L 100 mL/hr (06/13/19 0739)  . methocarbamol (ROBAXIN) IV 500 mg (06/13/19 0757)   Prior to Admission medications   Medication Sig Start Date End Date Taking?  Authorizing Provider  amphetamine-dextroamphetamine (ADDERALL) 20 MG tablet Take 20 mg by mouth daily.   Yes [provider]  ALPRAZolam (XANAX) 1 MG tablet TAKE 1 TAB PRIOR TO BLOOD DRAWS PRIOR TO SURGERY AS NEEDED FOR ANXIETY/PANIC ATTACKS. Patient not taking: Reported on 06/07/2019 01/12/18   Judd Gaudierorbett, Ashley, NP  amoxicillin (AMOXIL) 875 MG tablet Take 1 tablet (875 mg total) by mouth 2 (two) times daily. Patient not taking: Reported on 06/07/2019 05/15/17   Deatra Canterxford, William J, FNP  buPROPion (WELLBUTRIN XL) 150 MG 24 hr tablet Take 1 tablet (150 mg total) by mouth daily. Start with 1 tab daily; may increase to 2 tabs daily after 1 week. Patient not taking: Reported on 06/07/2019 12/09/17 12/09/18  Judd Gaudierorbett, Ashley, NP  busPIRone (BUSPAR) 10 MG tablet Take 1/2-1 tab up to three times daily as needed for anxiety Patient not taking: Reported on 06/07/2019 12/09/17   Judd Gaudierorbett, Ashley, NP  ondansetron (ZOFRAN ODT) 4 MG disintegrating tablet Take 1 tablet (4 mg total) by mouth every 8 (eight) hours as needed for nausea or vomiting. Patient not taking: Reported on 06/07/2019 05/15/17   Deatra Canterxford, William J, FNP  polyethylene glycol [facility](MIRALAX) packet Take 17 g by mouth 2 (two) times daily. Patient taking differently: Take  17 g by mouth as needed.  05/15/17   Lysbeth Penner, FNP    Assessment/Plan Anemia -transfused 1 unit 7/24 Vape use ADHD -  Adderall 20 mg daily, Creon Xanax, Wellbutrin 50 mg daily, BuSpar 10 mg 3 times daily as needed -he says he is not taking these.   Cecal volvulus  Exploratory laparotomy, extended right colectomy Dr. Rodman Key Tsuei's, 06/07/2019 POD #6  FEN - soft diet/IV fluids VTE -Lovenox -on hold 06/10/19 ID -Cefotetan on call to OR/preop, afebrile, WBC 7.3 Foley: DC 7/22 Follow up - Tsuei POC: Austin Austin [name], Austin [name] Mother   [phone]  Austin Austin [name], Austin Austin [name] Austin [name] Father [phone]     Pain control: Tylenol 1 g Xanax 1 mg twice daily 300 mg 3 times daily Dilaudid 1 mg x 6 IV, Toradol  15 mg every 6x4, Robaxin 500 mg every 6, oxycodone 10 mg x 4   Plan: Patient is having multiple loose stools.  He is tolerating soft diet well.  He thinks he can manage with Tylenol and oxycodone.   I am stopping all his IV pain medications except for the Dilaudid.    We will restart his Adderall, add Ibuprofen in place of Toradol.  The aim will be to wean down pain medications to just oral pain meds.    He is going home with his parents: He works as a Furniture conservator/restorer.  He still has the honeycomb dressing in place and it looks fine.    LOS: 6 days    Austin [name],Austin Austin [name] 06/13/2019 [phone]  Agree with above.  Doing okay.  He has some bruising around penis and scrotum - appears benign.  Alphonsa Overall, MD, [facility] Pager: [phone] Office phone:  [phone]

## 2019-06-13 NOTE — Progress Notes (Signed)
Nutrition Follow-up  DOCUMENTATION CODES:   Severe malnutrition in context of social or environmental circumstances, Underweight  INTERVENTION:   -D/c Boost Breeze po TID, each supplement provides 250 kcal and 9 grams of protein -Ensure Enlive po BID, each supplement provides 350 kcal and 20 grams of protein -Continue MVI with minerals daily  NUTRITION DIAGNOSIS:   Severe Malnutrition related to social / environmental circumstances as evidenced by moderate fat depletion, severe fat depletion, moderate muscle depletion, severe muscle depletion.  Ongoing  GOAL:   Patient will meet greater than or equal to 90% of their needs  Progressing   MONITOR:   PO intake, Supplement acceptance, Diet advancement, Labs, Weight trends, Skin, I & O's  REASON FOR ASSESSMENT:   Malnutrition Screening Tool    ASSESSMENT:   Pt is an otherwise healthy 28 yo male with a hx of BL laparoscopy inguinal hernia repairs early this year by Dr. Johney Maine who presented to the ED with complaints of abdominal pain. Pt states sudden onset of pain after eating last night. Pain is generalized, severe, constant with associated bloating and nausea. Pt denies vomiting. He states he has never had this type of pain before. He states some hx of constipation but has been having regular BM's recently. His last BM was yesterday. He denies urinary symptoms, cough, SOB, CP, illicit drug use, health issues, medication allergies. Pt states he drinks 2-3 beers a week. He would like [place] to call his mother with the plan.  7/21- NGT placed, s/p Procedure performed: Exploratory laparotomy with right hemicolectomy 7/22- NGT d/c 7/23- advanced to clear liquids 7/24- advanced to full liquids 7/26- advanced to soft diet   Reviewed I/O's: +600 ml x 24 hours and +3.9 L since admission  Pt tolerating soft diet well per MD. Noted meal completion 50-75%. Pt is accepting Boost Breeze supplements. Given fair oral intake and malnutrition, will  add Ensure Enlive due to increased nutrient density.  Labs reviewed.   Diet Order:   Diet Order            DIET SOFT Room service appropriate? Yes; Fluid consistency: Thin  Diet effective now              EDUCATION NEEDS:   Education needs have been addressed  Skin:  Skin Assessment: Skin Integrity Issues: Skin Integrity Issues:: Incisions Incisions: closed abdomen  Last BM:  06/13/19  Height:   Ht Readings from Last 1 Encounters:  06/08/19 5\' 11"  (1.803 m)    Weight:   Wt Readings from Last 1 Encounters:  06/08/19 49.9 kg    Ideal Body Weight:  78.2 kg  BMI:  Body mass index is 15.34 kg/m.  Estimated Nutritional Needs:   Kcal:  1800-2000  Protein:  100-115 grams  Fluid:  > 1.8 L    Fawna Cranmer A. Jimmye Norman, RD, LDN, [place] Registered Dietitian II Certified Diabetes Care and Education Specialist Pager: [phone] After hours Pager: [phone]

## 2019-06-13 NOTE — Discharge Summary (Signed)
Physician Discharge Summary  Patient ID: Austin [name] MRN: [id number] DOB/AGE: 29/03/1991 29 y.o.  Admit date: 06/07/2019 Discharge date: 06/18/2019  Admission Diagnoses:  Cecal volvulus versus sigmoid: Volvulus Vape use ADHD   Discharge Diagnoses:  Cecal volvulus Postop ileus - ?  Narcotic Vape use/ tobacco use/[place] use ADHD Postop anemia transfused 3 units of packed RBC's Postop pain control Moderate malnutrition  Active Problems:   Volvulus ([facility])   Protein-calorie malnutrition, severe   PROCEDURES: Oratory laparotomy, extended right colectomy, Dr. Manus RuddMatthew Tsuei 06/07/2019  Hospital Course:  Pt is an otherwise healthy 28 yo male with a hx of BL laparoscopy inguinal hernia repairs early this year by Dr. Michaell CowingGross who presented to the ED with complaints of abdominal pain. Pt states sudden onset of pain after eating last night. Pain is generalized, severe, constant with associated bloating and nausea. Pt denies vomiting. He states he has never had this type of pain before. He states some hx of constipation but has been having regular BM's recently. His last BM was yesterday. He denies urinary symptoms, cough, SOB, CP, illicit drug use, health issues, medication allergies. Pt states he drinks 2-3 beers a week. He would like us to call his mother with the plan.   Pt works as a Chartered certified accountantmachinist.  Patient was seen in the ED by Dr. Manus RuddMatthew Tsuei.  He was very distended tender with possible ischemic bowel.  He was taken directly to the OR from the emergency department.  He was found to have a cecal volvulus with ischemic right colon and terminal ileum; along with malrotation.  He underwent exploratory laparotomy with right hemicolectomy.  He had obvious ischemic colon directly behind the linea alba.  The colon was abnormal as we dilated and there were some serosal tears.  Dr. Craige CottaSood was able to untwist the mesentery.  And perform the procedure as described above.  He tolerated the procedure well return to  the floor in satisfactory condition.  A lot of pain postop he was taken a lot of Dilaudid from postoperative day #1.  He had minimal NG output, and remained hemodynamically stable.  He was placed on maximum pain relief with IV Dilaudid Tylenol Robaxin and Toradol.  NG was removed on the first postoperative day he was n.p.o. except for sips of clears.  He dropped his hemoglobin hematocrit but there were no signs of bloody bowel movements despite significant decrease in his hemoglobin there was concern he had some intraperitoneal bleeding.  Anticoagulation was held on 06/10/19.  He is made good progress with return of bowel function his diet was advanced to a soft diet on 06/12/2019.  He is taking rather large doses of pain medications including: Tylenol , Xanax 1 mg twice daily, Neurontin 300 mg 3 times daily, Dilaudid 1 mg x 6, Toradol 15 mg every 6x4 doses, Robaxin 500 mg IV every 6 and oxycodone 10 mg x 4.  We converted him over to all oral pain medications.   He developed a postoperative ileus about 3/days prior to discharge.  We just weaned him off all of his narcotics left him n.p.o.  Placed an NG tube to decompress him.  His bowel function return we started him on clear liquids and advance him to a soft diet.  He was subsequently ready to go home on 06/18/2019.  CBC Latest Ref Rng & Units 06/18/2019 06/16/2019 06/15/2019  WBC 4.0 - 10.5 K/uL 13.3(H) 9.1 10.5  Hemoglobin 13.0 - 17.0 g/dL 12.3(L) 10.7(L) 10.5(L)  Hematocrit 39.0 - 52.0 %  37.3(L) 32.3(L) 31.6(L)  Platelets 150 - 400 K/uL 570(H) 390 404(H)   CMP Latest Ref Rng & Units 06/18/2019 06/16/2019 06/15/2019  Glucose 70 - 99 mg/dL 93 101(H) 130(H)  BUN 6 - 20 mg/dL 7 9 13   Creatinine 0.61 - 1.24 mg/dL 0.76 0.80 0.91  Sodium 135 - 145 mmol/L 139 141 137  Potassium 3.5 - 5.1 mmol/L 3.8 3.6 3.3(L)  Chloride 98 - 111 mmol/L 111 103 94(L)  CO2 22 - 32 mmol/L 23 31 31   Calcium 8.9 - 10.3 mg/dL 8.0(L) 7.9(L) 8.4(L)  Total Protein 6.5 - 8.1 g/dL - - -   Total Bilirubin 0.3 - 1.2 mg/dL - - -  Alkaline Phos 38 - 126 U/L - - -  AST 15 - 41 U/L - - -  ALT 0 - 44 U/L - - -    Disposition: Discharge disposition: 01-Home or Self Care        Allergies as of 06/18/2019   No Known Allergies     Medication List    STOP taking these medications   polyethylene glycol 17 g packet Commonly known as: MiraLax     TAKE these medications   acetaminophen 500 MG tablet Commonly known as: TYLENOL Tylenol 1000 mg every 8 hours as needed for pain.  You can alternate this with ibuprofen.  Do not exceed 4000 mg of Tylenol per day.  You can buy this over-the-counter at any drugstore.   amphetamine-dextroamphetamine 20 MG tablet Commonly known as: ADDERALL Take 20 mg by mouth daily.   ibuprofen 200 MG tablet Commonly known as: ADVIL You can take 2 tablets every 6 hours as needed for pain.  You can alternate this with plain Tylenol/acetaminophen.  You can buy this over-the-counter at any drugstore.   nicotine 14 mg/24hr patch Commonly known as: NICODERM CQ - dosed in mg/24 hours You can buy this over-the-counter at any drugstore.  Follow package instructions.   oxyCODONE 5 MG immediate release tablet Commonly known as: Oxy IR/ROXICODONE Take 1 tablet (5 mg total) by mouth every 6 (six) hours as needed for breakthrough pain (Pain not relieved by Tylenol/ibuprofen).      Follow-up Information    Donnie Mesa, MD. Go on 06/27/2019.   Specialty: General Surgery Why: 10:50am for your follow up appointment. please arrive 30 min early to complete paperwork. Please bring photo ID and insurance card Contact information: [place] [address] [phone]        Unk Pinto, MD Follow up.   Specialty: Internal Medicine Why: Call for follow-up appointment.  If you need additional medicines for anxiety talk with Dr. Melford Aase.  Let him know you were hospitalized and follow-up for medical issues. Contact  information: [address] [place] [address] [phone]           Signed: Earnstine Regal 06/18/2019, 12:17 PM

## 2019-06-14 ENCOUNTER — Inpatient Hospital Stay (HOSPITAL_COMMUNITY): Payer: Commercial Managed Care - PPO

## 2019-06-14 LAB — COMPREHENSIVE METABOLIC PANEL
ALT: 22 U/L (ref 0–44)
AST: 31 U/L (ref 15–41)
Albumin: 2.8 g/dL — ABNORMAL LOW (ref 3.5–5.0)
Alkaline Phosphatase: 46 U/L (ref 38–126)
Anion gap: 10 (ref 5–15)
BUN: 13 mg/dL (ref 6–20)
CO2: 29 mmol/L (ref 22–32)
Calcium: 8.6 mg/dL — ABNORMAL LOW (ref 8.9–10.3)
Chloride: 95 mmol/L — ABNORMAL LOW (ref 98–111)
Creatinine, Ser: 0.79 mg/dL (ref 0.61–1.24)
GFR calc Af Amer: >60 mL/min (ref >60)
GFR calc non Af Amer: >60 mL/min (ref >60)
Glucose, Bld: 102 mg/dL — ABNORMAL HIGH (ref 70–99)
Potassium: 4 mmol/L (ref 3.5–5.1)
Sodium: 134 mmol/L — ABNORMAL LOW (ref 135–145)
Total Bilirubin: 1.2 mg/dL (ref 0.3–1.2)
Total Protein: 4.9 g/dL — ABNORMAL LOW (ref 6.5–8.1)

## 2019-06-14 LAB — CBC
HCT: 30.5 % — ABNORMAL LOW (ref 39.0–52.0)
Hemoglobin: 10.5 g/dL — ABNORMAL LOW (ref 13.0–17.0)
MCH: 31.2 pg (ref 26.0–34.0)
MCHC: 34.4 g/dL (ref 30.0–36.0)
MCV: 90.5 fL (ref 80.0–100.0)
Platelets: 335 10*3/uL (ref 150–400)
RBC: 3.37 MIL/uL — ABNORMAL LOW (ref 4.22–5.81)
RDW: 13.7 % (ref 11.5–15.5)
WBC: 11 10*3/uL — ABNORMAL HIGH (ref 4.0–10.5)
nRBC: 0 % (ref 0.0–0.2)

## 2019-06-14 LAB — PREALBUMIN: Prealbumin: 16 mg/dL — ABNORMAL LOW (ref 18–38)

## 2019-06-14 MED ORDER — PROMETHAZINE HCL 25 MG/ML IJ SOLN
12.5000 mg | Freq: Four times a day (QID) | INTRAMUSCULAR | Status: DC | PRN
  Administered 2019-06-14 – 2019-06-15 (×5): 12.5 mg via INTRAVENOUS
  Filled 2019-06-14 (×5): qty 1

## 2019-06-14 MED ORDER — DEXTROSE-NACL 5-0.9 % IV SOLN
INTRAVENOUS | Status: DC
  Administered 2019-06-14 – 2019-06-16 (×6): via INTRAVENOUS

## 2019-06-14 MED ORDER — KETOROLAC TROMETHAMINE 30 MG/ML IJ SOLN
30.0000 mg | Freq: Four times a day (QID) | INTRAMUSCULAR | Status: DC | PRN
  Administered 2019-06-14 – 2019-06-17 (×10): 30 mg via INTRAVENOUS
  Filled 2019-06-14 (×10): qty 1

## 2019-06-14 MED ORDER — PROMETHAZINE HCL 25 MG/ML IJ SOLN
6.2500 mg | Freq: Four times a day (QID) | INTRAMUSCULAR | Status: DC | PRN
  Administered 2019-06-14: 6.25 mg via INTRAVENOUS
  Filled 2019-06-14: qty 1

## 2019-06-14 MED ORDER — PROMETHAZINE HCL 25 MG/ML IJ SOLN
12.5000 mg | Freq: Four times a day (QID) | INTRAMUSCULAR | Status: DC | PRN
  Administered 2019-06-14: 12.5 mg via INTRAVENOUS
  Filled 2019-06-14: qty 1

## 2019-06-14 MED ORDER — FAMOTIDINE 20 MG PO TABS
20.0000 mg | ORAL_TABLET | Freq: Two times a day (BID) | ORAL | Status: DC
  Administered 2019-06-14 – 2019-06-15 (×2): 20 mg via ORAL
  Filled 2019-06-14 (×3): qty 1

## 2019-06-14 MED ORDER — OXYCODONE HCL 5 MG PO TABS
5.0000 mg | ORAL_TABLET | ORAL | Status: DC | PRN
  Administered 2019-06-14 – 2019-06-17 (×11): 5 mg via ORAL
  Filled 2019-06-14 (×11): qty 1

## 2019-06-14 MED ORDER — ALPRAZOLAM 0.5 MG PO TABS
0.5000 mg | ORAL_TABLET | Freq: Three times a day (TID) | ORAL | Status: DC | PRN
  Administered 2019-06-14 – 2019-06-17 (×3): 0.5 mg via ORAL
  Filled 2019-06-14 (×3): qty 1

## 2019-06-14 MED ORDER — METHOCARBAMOL 1000 MG/10ML IJ SOLN
1000.0000 mg | Freq: Three times a day (TID) | INTRAVENOUS | Status: DC
  Administered 2019-06-14 – 2019-06-17 (×8): 1000 mg via INTRAVENOUS
  Filled 2019-06-14: qty 10
  Filled 2019-06-14: qty 500
  Filled 2019-06-14 (×8): qty 10

## 2019-06-14 NOTE — Progress Notes (Signed)
Called for recurrent nausea and vomiting.  He has had zofran x 4 today, and one dose last PM.  He has also had 2 doses of phenergan today;  one at 2 Am and the second at noon.  Abdomen is distended and no BS this PM.  He had several loose stools yesterday, but none today.   ? Ileus  Prealbumin 16 - moderate malnutrition   Plan:  Go back to clears, abdominal film, restart some IV fluids.  His mother says he has had issues with addiction in the past,but she did not know what he was on.  He has never been in any rehab program that she know of.

## 2019-06-14 NOTE — Progress Notes (Signed)
Patient having more emesis, moderate amount, green liquid in color. Paged PA at [phone] awaiting call back. Administered zofran per orders. Will continue to monitor for remainder of shift.

## 2019-06-14 NOTE — Progress Notes (Signed)
Patient vomited x 2. Already given Zofran with no effective. MD Paged. New orders received.

## 2019-06-14 NOTE — Progress Notes (Signed)
Patient c/o nausea with moderate amount of emesis, dark green bile. Patient medicated with phenergan per orders. Settled into bed. Will continue to monitor.

## 2019-06-14 NOTE — Progress Notes (Signed)
Subjective Reported emesis last night; none this morning but hasn't tried breakfast yet. IV dilaudid last night - 6 mg in last 24hrs. Complains of insomnia as dose is being decreased. Passing gas and having BMs.   Objective: Vital signs in last 24 hours: Temp:  [99.2 F (37.3 C)-99.4 F (37.4 C)] 99.3 F (37.4 C) (07/28 0500) Pulse Rate:  [66-83] 83 (07/28 0500) Resp:  [16-17] 16 (07/28 0500) BP: (116-121)/(72-74) 116/72 (07/28 0500) SpO2:  [95 %-98 %] 95 % (07/28 0500) Last BM Date: 06/13/19  Intake/Output from previous day: 07/27 0701 - 07/28 0700 In: 537 [P.O.:537] Out: -  Intake/Output this shift: No intake/output data recorded.  Gen: NAD, comfortable CV: RRR Pulm: Normal work of breathing Abd: Soft, not significantly tender, mildly distended; no rebound; no guarding; incision c/d/i without erythema Ext: SCDs in place  Lab Results: CBC  Recent Labs    06/14/19 0244  WBC 11.0*  HGB 10.5*  HCT 30.5*  PLT 335   BMET Recent Labs    06/14/19 0244  NA 134*  K 4.0  CL 95*  CO2 29  GLUCOSE 102*  BUN 13  CREATININE 0.79  CALCIUM 8.6*   PT/INR No results for input(s): LABPROT, INR in the last 72 hours. ABG No results for input(s): PHART, HCO3 in the last 72 hours.  Invalid input(s): PCO2, PO2  Studies/Results:  Anti-infectives: Anti-infectives (From admission, onward)   Start     Dose/Rate Route Frequency Ordered Stop   06/07/19 1515  cefoTEtan (CEFOTAN) 2 g in sodium chloride 0.9 % 100 mL IVPB     2 g 200 mL/hr over 30 Minutes Intravenous To ShortStay Surgical 06/07/19 1245 06/07/19 1527   06/07/19 1409  sodium chloride 0.9 % with cefoTEtan (CEFOTAN) ADS Med    Note to Pharmacy: Henrine Screws   : cabinet override      06/07/19 1409 06/07/19 1457       Assessment/Plan: Patient Active Problem List   Diagnosis Date Noted  . Protein-calorie malnutrition, severe 06/13/2019  . Volvulus ([place]) 06/07/2019  . BMI less than 19,adult 05/30/2018  . Anxiety  12/09/2017  . Right inguinal hernia 12/09/2017  . ADD (attention deficit disorder)    s/p Procedure(s): EXPLORATORY LAPAROTOMY Right Hemi Colectomy 06/07/2019  -Continue soft diet - needs to have better oral intake at this point -Will schedule tylenol; continue prns - ibuprofen, Oxycodone 5-10mg  PO Q4hrs; robaxin -Maalox + famotidine for GERD/reflux -Ambien ordered for his insomnia; states all he wants is IV phenergan and dilaudid to help him sleep - discussed rational for weaning these medications including dependence -Afebrile; VSS; repeat CBC tomorrow -I had a long discussion with him this morning (25 minutes) about the plan of care, rational for limiting narcotics, importance of ambulation; trying to avoid daytime naps as this will make his ability to sleep at night worse   LOS: 7 days   Sharon Mt. Dema Severin, M.D. [facility]

## 2019-06-15 ENCOUNTER — Inpatient Hospital Stay (HOSPITAL_COMMUNITY): Payer: Commercial Managed Care - PPO

## 2019-06-15 LAB — BASIC METABOLIC PANEL
Anion gap: 12 (ref 5–15)
BUN: 13 mg/dL (ref 6–20)
CO2: 31 mmol/L (ref 22–32)
Calcium: 8.4 mg/dL — ABNORMAL LOW (ref 8.9–10.3)
Chloride: 94 mmol/L — ABNORMAL LOW (ref 98–111)
Creatinine, Ser: 0.91 mg/dL (ref 0.61–1.24)
GFR calc Af Amer: >60 mL/min (ref >60)
GFR calc non Af Amer: >60 mL/min (ref >60)
Glucose, Bld: 130 mg/dL — ABNORMAL HIGH (ref 70–99)
Potassium: 3.3 mmol/L — ABNORMAL LOW (ref 3.5–5.1)
Sodium: 137 mmol/L (ref 135–145)

## 2019-06-15 LAB — CBC WITH DIFFERENTIAL/PLATELET
Abs Immature Granulocytes: 0.15 10*3/uL — ABNORMAL HIGH (ref 0.00–0.07)
Basophils Absolute: 0 10*3/uL (ref 0.0–0.1)
Basophils Relative: 0 %
Eosinophils Absolute: 0.1 10*3/uL (ref 0.0–0.5)
Eosinophils Relative: 1 %
HCT: 31.6 % — ABNORMAL LOW (ref 39.0–52.0)
Hemoglobin: 10.5 g/dL — ABNORMAL LOW (ref 13.0–17.0)
Immature Granulocytes: 1 %
Lymphocytes Relative: 6 %
Lymphs Abs: 0.7 10*3/uL (ref 0.7–4.0)
MCH: 30.5 pg (ref 26.0–34.0)
MCHC: 33.2 g/dL (ref 30.0–36.0)
MCV: 91.9 fL (ref 80.0–100.0)
Monocytes Absolute: 1.5 10*3/uL — ABNORMAL HIGH (ref 0.1–1.0)
Monocytes Relative: 15 %
Neutro Abs: 8 10*3/uL — ABNORMAL HIGH (ref 1.7–7.7)
Neutrophils Relative %: 77 %
Platelets: 404 10*3/uL — ABNORMAL HIGH (ref 150–400)
RBC: 3.44 MIL/uL — ABNORMAL LOW (ref 4.22–5.81)
RDW: 13.7 % (ref 11.5–15.5)
WBC: 10.5 10*3/uL (ref 4.0–10.5)
nRBC: 0 % (ref 0.0–0.2)

## 2019-06-15 LAB — MAGNESIUM: Magnesium: 2.5 mg/dL — ABNORMAL HIGH (ref 1.7–2.4)

## 2019-06-15 MED ORDER — POTASSIUM CHLORIDE 10 MEQ/100ML IV SOLN
10.0000 meq | INTRAVENOUS | Status: AC
  Administered 2019-06-15 (×5): 10 meq via INTRAVENOUS
  Filled 2019-06-15 (×5): qty 100

## 2019-06-15 MED ORDER — ACETAMINOPHEN 10 MG/ML IV SOLN
1000.0000 mg | Freq: Four times a day (QID) | INTRAVENOUS | Status: AC
  Administered 2019-06-15 – 2019-06-16 (×4): 1000 mg via INTRAVENOUS
  Filled 2019-06-15 (×4): qty 100

## 2019-06-15 MED ORDER — PHENOL 1.4 % MT LIQD
1.0000 | OROMUCOSAL | Status: DC | PRN
  Administered 2019-06-15: 1 via OROMUCOSAL
  Filled 2019-06-15: qty 177

## 2019-06-15 MED ORDER — ENOXAPARIN SODIUM 40 MG/0.4ML ~~LOC~~ SOLN
40.0000 mg | Freq: Every day | SUBCUTANEOUS | Status: DC
  Administered 2019-06-15 – 2019-06-18 (×4): 40 mg via SUBCUTANEOUS
  Filled 2019-06-15 (×4): qty 0.4

## 2019-06-15 MED ORDER — MORPHINE SULFATE (PF) 2 MG/ML IV SOLN
2.0000 mg | INTRAVENOUS | Status: DC | PRN
  Administered 2019-06-15 – 2019-06-16 (×5): 2 mg via INTRAVENOUS
  Filled 2019-06-15 (×6): qty 1

## 2019-06-15 MED ORDER — ROPINIROLE HCL ER 4 MG PO TB24
4.0000 mg | ORAL_TABLET | Freq: Every day | ORAL | Status: DC
  Filled 2019-06-15 (×2): qty 1

## 2019-06-15 MED ORDER — MENTHOL 3 MG MT LOZG
1.0000 | LOZENGE | OROMUCOSAL | Status: DC | PRN
  Administered 2019-06-15: 3 mg via ORAL
  Filled 2019-06-15 (×3): qty 9

## 2019-06-15 NOTE — Progress Notes (Addendum)
Nutrition Follow-up  DOCUMENTATION CODES:   Severe malnutrition in context of social or environmental circumstances, Underweight  INTERVENTION:   -RD will follow for diet advancement and supplement as appropriate  NUTRITION DIAGNOSIS:   Severe Malnutrition related to social / environmental circumstances as evidenced by moderate fat depletion, severe fat depletion, moderate muscle depletion, severe muscle depletion.  Ongoing  GOAL:   Patient will meet greater than or equal to 90% of their needs  Unmet  MONITOR:   PO intake, Supplement acceptance, Diet advancement, Labs, Weight trends, Skin, I & O's  REASON FOR ASSESSMENT:   Malnutrition Screening Tool    ASSESSMENT:   Pt is an otherwise healthy 28 yo male with a hx of BL laparoscopy inguinal hernia repairs early this year by Dr. Johney Maine who presented to the ED with complaints of abdominal pain. Pt states sudden onset of pain after eating last night. Pain is generalized, severe, constant with associated bloating and nausea. Pt denies vomiting. He states he has never had this type of pain before. He states some hx of constipation but has been having regular BM's recently. His last BM was yesterday. He denies urinary symptoms, cough, SOB, CP, illicit drug use, health issues, medication allergies. Pt states he drinks 2-3 beers a week. He would like [place] to call his mother with the plan.  7/21- NGT placed, s/pProcedure performed: Exploratory laparotomy with right hemicolectomy 7/22- NGT d/c 7/23- advanced to clear liquids 7/24- advanced to full liquids 7/26- advanced to soft diet  7/28- reverted to clear liquid diet secondary to nausea and vomiting 7/29- NPO, NGT inserted for deocmpression  Reviewed I/O's: -1 ml x 24 hours and +4.4 L since admission  NGT output: 1.1 L x 24 hours  Pt with multiple episodes of nausea and vomiting over the past 24 hours.NGT has been placed for decompression and is now NPO. Plan for bowel rest.     Labs reviewed: K: 3.3 (on IV supplementation), Mg: 2.5.   Diet Order:   Diet Order            Diet NPO time specified Except for: Ice Chips  Diet effective now              EDUCATION NEEDS:   Education needs have been addressed  Skin:  Skin Assessment: Skin Integrity Issues: Skin Integrity Issues:: Incisions Incisions: closed abdomen  Last BM:  06/13/19  Height:   Ht Readings from Last 1 Encounters:  06/08/19 5\' 11"  (1.803 m)    Weight:   Wt Readings from Last 1 Encounters:  06/08/19 49.9 kg    Ideal Body Weight:  78.2 kg  BMI:  Body mass index is 15.34 kg/m.  Estimated Nutritional Needs:   Kcal:  1800-2000  Protein:  100-115 grams  Fluid:  > 1.8 L    Lealon Vanputten A. Jimmye Norman, RD, LDN, [place] Registered Dietitian II Certified Diabetes Care and Education Specialist Pager: [phone] After hours Pager: [phone]

## 2019-06-15 NOTE — Progress Notes (Signed)
Patient notified RN that scrotum is swollen, not painful. Patient assessed, all of scrotum is bruised, denies pain, is voiding without complaints. Will report to oncoming nursing staff.

## 2019-06-15 NOTE — Progress Notes (Signed)
Patient had NG tube inserted. Abd film with NG tube in stomach. He has 600cc bilious output in cannister. Reports his nausea has resolved. Abdomen is soft, with less distension and no tenderness. Hypoactive bowel sounds. Potassium being replaced. Mg level reviewed. Will recheck labs and film in AM.

## 2019-06-15 NOTE — Progress Notes (Signed)
8 Days Post-Op    CC: Nausea   Subjective: Patient reports constant nausea. He had about 5 episodes of bilious like emesis overnight and had one just prior to me entering the room. He reports minimal to no abdominal pain.   Objective: Vital signs in last 24 hours: Temp:  [98.6 F (37 C)-99.4 F (37.4 C)] 98.6 F (37 C) (07/29 0409) Pulse Rate:  [67-78] 78 (07/29 0409) Resp:  [18-19] 19 (07/29 0409) BP: (117-122)/(65-68) 120/68 (07/29 0409) SpO2:  [93 %-100 %] 100 % (07/29 0409) Last BM Date: 06/13/19 Nothing PO recorded IV fluids restarted yesterday PM but not recorded Emesis x 2 yesterday recorded  Afebrile, VSS K+ 3.3, creatinine 0.91 WBC down to 10.5  Intake/Output from previous day: 07/28 0701 - 07/29 0700 In: 98.7 [I.V.:98.7] Out: 100 [Blood:100] Intake/Output this shift: No intake/output data recorded.  Gen: Awake and alert, NAD Lungs: normal rate and effort Abd: Soft, mild distension, NT, hypoactive BS. Midline incision with staples in place and appears c/d/i.  Msk: no edema  Lab Results:  Recent Labs    06/14/19 0244 06/15/19 0346  WBC 11.0* 10.5  HGB 10.5* 10.5*  HCT 30.5* 31.6*  PLT 335 404*    BMET Recent Labs    06/14/19 0244 06/15/19 0346  NA 134* 137  K 4.0 3.3*  CL 95* 94*  CO2 29 31  GLUCOSE 102* 130*  BUN 13 13  CREATININE 0.79 0.91  CALCIUM 8.6* 8.4*   PT/INR No results for input(s): LABPROT, INR in the last 72 hours.  Recent Labs  Lab 06/14/19 0244  AST 31  ALT 22  ALKPHOS 46  BILITOT 1.2  PROT 4.9*  ALBUMIN 2.8*     Lipase     Component Value Date/Time   LIPASE 28 06/07/2019 0600     Medications: . sodium chloride   Intravenous Once  . acetaminophen  1,000 mg Oral Q6H  . Chlorhexidine Gluconate Cloth  6 each Topical Daily  . famotidine  20 mg Oral BID  . feeding supplement (ENSURE ENLIVE)  237 mL Oral BID BM   Anti-infectives (From admission, onward)   Start     Dose/Rate Route Frequency Ordered Stop   06/07/19 1515  cefoTEtan (CEFOTAN) 2 g in sodium chloride 0.9 % 100 mL IVPB     2 g 200 mL/hr over 30 Minutes Intravenous To [facility] 06/07/19 1245 06/07/19 1527   06/07/19 1409  sodium chloride 0.9 % with cefoTEtan (CEFOTAN) ADS Med    Note to Pharmacy: Shireen Quanodd, [name]   : cabinet override      06/07/19 1409 06/07/19 1457      Assessment/Plan Anemia -transfused 1 unit 7/24 Vape use ADHD -  Adderall 20 mg daily,  Moderate Malnutrition - prealbumin 16 Pain control  Cecal volvulus S/p Exploratory laparotomy, extended right colectomy Dr. Molli HazardMatthew Tsuei's, 06/07/2019 POD #8 - Post op ileus. Likely some component of opoid's in decreasing gut motility - Insert NG tube  - NPO, IVF, Replace K, Check Mg - Minimize Narcotics  - Mobilize for bowel function   FEN - NPO, NGT, IVF, Replace K VTE -Lovenox restarted. Hgb stable x 5 days  ID -Cefotetan peri-op, afebrile, WBC10.5 Foley: DC'd 7/22 Follow up - Tsuei POC:  Wilfred Lacyuttle, Renee Mother   [phone]  Silvestre Momentuttle,Camrin R Father [phone]     I discussed with patient the plan for NGT, bowel rest and minimizing narcotics. The patient reports to me that he takes a supplement at home called Kratom  and is afraid that if he does not have any narcotics he will go through opoid withdraw.     LOS: 8 days    [place] 06/15/2019  [facility] [phone]

## 2019-06-15 NOTE — Plan of Care (Signed)
  Problem: Clinical Measurements: Goal: Ability to maintain clinical measurements within normal limits will improve Outcome: Progressing Goal: Postoperative complications will be avoided or minimized Outcome: Progressing   

## 2019-06-16 ENCOUNTER — Inpatient Hospital Stay (HOSPITAL_COMMUNITY): Payer: Commercial Managed Care - PPO

## 2019-06-16 LAB — CBC
HCT: 32.3 % — ABNORMAL LOW (ref 39.0–52.0)
Hemoglobin: 10.7 g/dL — ABNORMAL LOW (ref 13.0–17.0)
MCH: 30.8 pg (ref 26.0–34.0)
MCHC: 33.1 g/dL (ref 30.0–36.0)
MCV: 93.1 fL (ref 80.0–100.0)
Platelets: 390 10*3/uL (ref 150–400)
RBC: 3.47 MIL/uL — ABNORMAL LOW (ref 4.22–5.81)
RDW: 13.6 % (ref 11.5–15.5)
WBC: 9.1 10*3/uL (ref 4.0–10.5)
nRBC: 0 % (ref 0.0–0.2)

## 2019-06-16 LAB — BASIC METABOLIC PANEL
Anion gap: 7 (ref 5–15)
BUN: 9 mg/dL (ref 6–20)
CO2: 31 mmol/L (ref 22–32)
Calcium: 7.9 mg/dL — ABNORMAL LOW (ref 8.9–10.3)
Chloride: 103 mmol/L (ref 98–111)
Creatinine, Ser: 0.8 mg/dL (ref 0.61–1.24)
GFR calc Af Amer: >60 mL/min (ref >60)
GFR calc non Af Amer: >60 mL/min (ref >60)
Glucose, Bld: 101 mg/dL — ABNORMAL HIGH (ref 70–99)
Potassium: 3.6 mmol/L (ref 3.5–5.1)
Sodium: 141 mmol/L (ref 135–145)

## 2019-06-16 MED ORDER — ROPINIROLE HCL 1 MG PO TABS
4.0000 mg | ORAL_TABLET | Freq: Every day | ORAL | Status: DC
  Administered 2019-06-16: 4 mg via ORAL
  Filled 2019-06-16 (×2): qty 4

## 2019-06-16 MED ORDER — PROMETHAZINE HCL 25 MG/ML IJ SOLN: 6.2500 mg | Freq: Four times a day (QID) | INTRAMUSCULAR | Status: DC | PRN

## 2019-06-16 MED ORDER — NICOTINE 14 MG/24HR TD PT24
14.0000 mg | MEDICATED_PATCH | Freq: Every day | TRANSDERMAL | Status: DC
  Administered 2019-06-16 – 2019-06-18 (×3): 14 mg via TRANSDERMAL
  Filled 2019-06-16 (×3): qty 1

## 2019-06-16 MED ORDER — FAMOTIDINE IN NACL 20-0.9 MG/50ML-% IV SOLN
20.0000 mg | Freq: Two times a day (BID) | INTRAVENOUS | Status: DC
  Administered 2019-06-16 – 2019-06-18 (×5): 20 mg via INTRAVENOUS
  Filled 2019-06-16 (×5): qty 50

## 2019-06-16 MED ORDER — KCL IN DEXTROSE-NACL 40-5-0.9 MEQ/L-%-% IV SOLN
INTRAVENOUS | Status: DC
  Administered 2019-06-16 – 2019-06-18 (×3): via INTRAVENOUS
  Filled 2019-06-16 (×5): qty 1000

## 2019-06-16 NOTE — Progress Notes (Signed)
Pt had a medium sized bowel movement this pm

## 2019-06-16 NOTE — Progress Notes (Signed)
Pt ambulated several times on unit. Did not utilize as much narcotic pain medication on this shift as in previous days

## 2019-06-16 NOTE — Progress Notes (Signed)
9 Days Post-Op    CC: Abdominal pain  Subjective: Patient is asking if he can eat he says he is dying of hunger.  He be happy with just some clear liquids.  He is taking ice chips and looks like he is taking a fair amount.  He says he is having flatus, no BM.  He is also worried about the ecchymosis in his scrotum.  Midline incision looks fine.  Bowel sounds are still hypoactive.   Objective: Vital signs in last 24 hours: Temp:  [98.5 F (36.9 C)-99.1 F (37.3 C)] 98.5 F (36.9 C) (07/30 0532) Pulse Rate:  [73-76] 76 (07/30 0532) Resp:  [17-18] 17 (07/30 0532) BP: (101-111)/(63-72) 111/72 (07/30 0532) SpO2:  [94 %-100 %] 94 % (07/30 0532) Last BM Date: 06/15/19 120 p.o. 1700 IV 1700 urine 3200 NG No BM recorded Afebrile vital signs are stable Electrolytes are stable.  Hemoglobin hematocrit are stable WBC 9.1 Intake/Output from previous day: 07/29 0701 - 07/30 0700 In: 1802.1 [P.O.:120; I.V.:1402.1; NG/GT:30; IV Piggyback:250] Out: 4900 [Urine:1700; Emesis/NG output:3200] Intake/Output this shift: No intake/output data recorded.  General appearance: alert, cooperative and no distress Resp: clear to auscultation bilaterally GI: Soft midline incision looks good.  Bowel sounds are hypoactive some flatus no BM.  He also has a fair amount of ecchymosis in the scrotum but no significant swelling.  Lab Results:  Recent Labs    06/15/19 0346 06/16/19 0117  WBC 10.5 9.1  HGB 10.5* 10.7*  HCT 31.6* 32.3*  PLT 404* 390    BMET Recent Labs    06/15/19 0346 06/16/19 0117  NA 137 141  K 3.3* 3.6  CL 94* 103  CO2 31 31  GLUCOSE 130* 101*  BUN 13 9  CREATININE 0.91 0.80  CALCIUM 8.4* 7.9*   PT/INR No results for input(s): LABPROT, INR in the last 72 hours.  Recent Labs  Lab 06/14/19 0244  AST 31  ALT 22  ALKPHOS 46  BILITOT 1.2  PROT 4.9*  ALBUMIN 2.8*     Lipase     Component Value Date/Time   LIPASE 28 06/07/2019 0600     Medications: .  Chlorhexidine Gluconate Cloth  6 each Topical Daily  . enoxaparin (LOVENOX) injection  40 mg Subcutaneous Daily  . famotidine  20 mg Oral BID  . rOPINIRole  4 mg Oral QHS   . sodium chloride 10 mL/hr at 06/08/19 1100  . dextrose 5 % and 0.9% NaCl 125 mL/hr at 06/16/19 0930  . methocarbamol (ROBAXIN) IV 1,000 mg (06/16/19 0603)    Assessment/Plan Anemia-transfused 1 unit 7/24 Vapeuse ADHD-Adderall 20 mg daily,  Moderate Malnutrition - prealbumin 16 Pain control: IV Tylenol 1 g every 6; Toradol 30 mg IV every 6x3 doses; Robaxin 1 g IV every 8, morphine 2 mg IV x3 doses yesterday and 2 this a.m. oxycodone 5 mg x 2 yesterday and 1 this a.m.  Phenergan 12.5 mg IV x3 yesterday  Cecal volvulus S/p Exploratory laparotomy, extended right colectomy Dr. Molli HazardMatthew Tsuei's, 06/07/2019 POD #9 - Post op ileus. Likely some component of opoid's in decreasing gut motility - Insert NG tube  - NPO, IVF, Replace K, Check Mg - Minimize Narcotics  - Mobilize for bowel function   FEN - NPO, NGT, IVF, Replace K VTE -Lovenoxrestarted 06/15/19. Hgb stable x 5 days  ID -Cefotetan peri-op, afebrile, WBC10.5 Foley: DC'd 7/22 Follow up - Tsuei POC:  Wilfred Lacyuttle, Renee Mother   [phone]  [place],Ojani R Father [phone]  Plan: Add potassium to his IV fluids, continue IV fluids.  I will try some clamping trials and see how he does and given some sips and chips.  Encourage walking.  I told him the less narcotic he uses thesooner his gut would return to normal.  Labs are stable.       LOS: 9 days    Kauri Garson 06/16/2019 [phone]

## 2019-06-16 NOTE — Progress Notes (Signed)
NGT clamping trials started this pm. Pt has not c/o nausea and vomitting. Will continue to monitor

## 2019-06-17 MED ORDER — AMPHETAMINE-DEXTROAMPHETAMINE 10 MG PO TABS
10.0000 mg | ORAL_TABLET | Freq: Every day | ORAL | Status: DC
  Administered 2019-06-18: 10 mg via ORAL
  Filled 2019-06-17: qty 1

## 2019-06-17 MED ORDER — ACETAMINOPHEN 500 MG PO TABS
1000.0000 mg | ORAL_TABLET | Freq: Three times a day (TID) | ORAL | Status: DC
  Administered 2019-06-18: 1000 mg via ORAL
  Filled 2019-06-17: qty 2

## 2019-06-17 MED ORDER — METHOCARBAMOL 750 MG PO TABS
750.0000 mg | ORAL_TABLET | Freq: Three times a day (TID) | ORAL | Status: DC | PRN
  Administered 2019-06-17 (×2): 750 mg via ORAL
  Filled 2019-06-17 (×2): qty 1

## 2019-06-17 MED ORDER — ACETAMINOPHEN 500 MG PO TABS
1000.0000 mg | ORAL_TABLET | Freq: Three times a day (TID) | ORAL | Status: AC
  Administered 2019-06-17 (×2): 1000 mg via ORAL
  Filled 2019-06-17 (×2): qty 2

## 2019-06-17 MED ORDER — OXYCODONE HCL 5 MG PO TABS
5.0000 mg | ORAL_TABLET | Freq: Four times a day (QID) | ORAL | Status: DC | PRN
  Administered 2019-06-17 – 2019-06-18 (×3): 5 mg via ORAL
  Filled 2019-06-17 (×3): qty 1

## 2019-06-17 MED ORDER — ALPRAZOLAM 0.5 MG PO TABS
0.5000 mg | ORAL_TABLET | Freq: Two times a day (BID) | ORAL | Status: DC | PRN
  Administered 2019-06-17 – 2019-06-18 (×2): 0.5 mg via ORAL
  Filled 2019-06-17 (×2): qty 1

## 2019-06-17 MED ORDER — ADULT MULTIVITAMIN W/MINERALS CH
1.0000 | ORAL_TABLET | Freq: Every day | ORAL | Status: DC
  Administered 2019-06-17 – 2019-06-18 (×2): 1 via ORAL
  Filled 2019-06-17 (×2): qty 1

## 2019-06-17 MED ORDER — ENSURE ENLIVE PO LIQD: 237.0000 mL | Freq: Three times a day (TID) | ORAL | Status: DC

## 2019-06-17 MED ORDER — IBUPROFEN 600 MG PO TABS: 600.0000 mg | ORAL_TABLET | Freq: Four times a day (QID) | ORAL | Status: DC | PRN

## 2019-06-17 MED ORDER — BOOST / RESOURCE BREEZE PO LIQD CUSTOM
1.0000 | Freq: Three times a day (TID) | ORAL | Status: AC
  Administered 2019-06-17 (×3): 1 via ORAL

## 2019-06-17 NOTE — Progress Notes (Signed)
Pt on NGT clamping trials. Pt has not c/o nausea and vomiting all night.

## 2019-06-17 NOTE — Progress Notes (Signed)
Pt had bowel movement and no nausea/vomiting noted at this time, continue to ambulates.

## 2019-06-17 NOTE — Plan of Care (Signed)
  Problem: Clinical Measurements: Goal: Ability to maintain clinical measurements within normal limits will improve Outcome: Progressing Goal: Postoperative complications will be avoided or minimized Outcome: Progressing   

## 2019-06-17 NOTE — Progress Notes (Signed)
Nutrition Follow-up  DOCUMENTATION CODES:   Severe malnutrition in context of social or environmental circumstances, Underweight  INTERVENTION:   -Boost Breeze po TID, each supplement provides 250 kcal and 9 grams of protein -Once diet is advanced, transition to Ensure Enlive po TID, each supplement provides 350 kcal and 20 grams of protein -MVI with minerals daily  NUTRITION DIAGNOSIS:   Severe Malnutrition related to social / environmental circumstances as evidenced by moderate fat depletion, severe fat depletion, moderate muscle depletion, severe muscle depletion.  Ongoing  GOAL:   Patient will meet greater than or equal to 90% of their needs  Progressing  MONITOR:   PO intake, Supplement acceptance, Diet advancement, Labs, Weight trends, Skin, I & O's  REASON FOR ASSESSMENT:   Malnutrition Screening Tool    ASSESSMENT:   Pt is an otherwise healthy 28 yo male with a hx of BL laparoscopy inguinal hernia repairs early this year by Dr. Johney Maine who presented to the ED with complaints of abdominal pain. Pt states sudden onset of pain after eating last night. Pain is generalized, severe, constant with associated bloating and nausea. Pt denies vomiting. He states he has never had this type of pain before. He states some hx of constipation but has been having regular BM's recently. His last BM was yesterday. He denies urinary symptoms, cough, SOB, CP, illicit drug use, health issues, medication allergies. Pt states he drinks 2-3 beers a week. He would like [place] to call his mother with the plan.  7/21- NGT placed, s/pProcedure performed: Exploratory laparotomy with right hemicolectomy 7/22- NGT d/c 7/23- advanced to clear liquids 7/24- advanced to full liquids 7/26- advanced to soft diet  7/28- reverted to clear liquid diet secondary to nausea and vomiting 7/29- NPO, NGT inserted for decompression 7/31- NGT clamped, advanced to full liquids  Reviewed I/O's: -575 ml x 24 hours and  +715 ml since admission  UOP: 1 L x 24 hours  NGT output: 1.2 L x 24 hours  Case discussed with RN, who reports NGT is currently clamped with potential to remove later on today iof pt tolerates. Pt has just been advanced to a clear liquid diet. Pt has been using large amount of pain medication per RN.   Per general surgery notes, pt will likely be advanced to full liquid tomorrow AM with possible discharge later on tomorrow if tolerates diet.   Labs reviewed.   Diet Order:   Diet Order            Diet full liquid Room service appropriate? Yes; Fluid consistency: Thin  Diet effective 0500 tomorrow        Diet clear liquid Room service appropriate? Yes; Fluid consistency: Thin  Diet effective now              EDUCATION NEEDS:   Education needs have been addressed  Skin:  Skin Assessment: Skin Integrity Issues: Skin Integrity Issues:: Incisions Incisions: closed abdomen  Last BM:  06/16/19  Height:   Ht Readings from Last 1 Encounters:  06/08/19 5\' 11"  (1.803 m)    Weight:   Wt Readings from Last 1 Encounters:  06/08/19 49.9 kg    Ideal Body Weight:  78.2 kg  BMI:  Body mass index is 15.34 kg/m.  Estimated Nutritional Needs:   Kcal:  1800-2000  Protein:  100-115 grams  Fluid:  > 1.8 L    Tracy Kinner A. Jimmye Norman, RD, LDN, [place] Registered Dietitian II Certified Diabetes Care and Education Specialist Pager: [phone] After hours  Pager: [phone]

## 2019-06-17 NOTE — Progress Notes (Signed)
Pt on full liquid diet tolerates well no s/s of nausea /vomiting, he ambulates 3-4x today, discontinued NGT.

## 2019-06-17 NOTE — Progress Notes (Signed)
10 Days Post-Op    ZO:[id number]:abdominal pain  Subjective: Doing better this AM.  He is figured out the Dilaudid probably caused his ileus.  He wants to cancel the narcotic altogether, and restart his Adderall  Objective: Vital signs in last 24 hours: Temp:  [97.5 F (36.4 C)-98.8 F (37.1 C)] 97.5 F (36.4 C) (07/31 0456) Pulse Rate:  [69-75] 69 (07/31 0456) Resp:  [18] 18 (07/31 0456) BP: (109-117)/(69-76) 117/75 (07/31 0456) SpO2:  [97 %-98 %] 98 % (07/31 0456) Last BM Date: 06/16/19 120 PO 1600 IV 1100 urine 1150 NG. BM x 2 Afebrile, VSS No labs today Intake/Output from previous day: 07/30 0701 - 07/31 0700 In: 1600 [I.V.:1400; IV Piggyback:200] Out: 2175 [Urine:1025; Emesis/NG output:1150] Intake/Output this shift: Total I/O In: 120 [P.O.:120] Out: 1500 [Urine:1100; Emesis/NG output:400]  General appearance: alert, cooperative and no distress Resp: clear to auscultation bilaterally GI: Soft, soreness better positive bowel sounds positive BM.  Lab Results:  Recent Labs    06/15/19 0346 06/16/19 0117  WBC 10.5 9.1  HGB 10.5* 10.7*  HCT 31.6* 32.3*  PLT 404* 390    BMET Recent Labs    06/15/19 0346 06/16/19 0117  NA 137 141  K 3.3* 3.6  CL 94* 103  CO2 31 31  GLUCOSE 130* 101*  BUN 13 9  CREATININE 0.91 0.80  CALCIUM 8.4* 7.9*   PT/INR No results for input(s): LABPROT, INR in the last 72 hours.  Recent Labs  Lab 06/14/19 0244  AST 31  ALT 22  ALKPHOS 46  BILITOT 1.2  PROT 4.9*  ALBUMIN 2.8*     Lipase     Component Value Date/Time   LIPASE 28 06/07/2019 0600     Medications: . Chlorhexidine Gluconate Cloth  6 each Topical Daily  . enoxaparin (LOVENOX) injection  40 mg Subcutaneous Daily  . nicotine  14 mg Transdermal Daily  . rOPINIRole  4 mg Oral QHS    Assessment/Plan Anemia-transfused 1 unit 7/24 Vapeuse/tobacco use/Kartom use ADHD-Adderall 20 mg daily, Moderate Malnutrition - prealbumin 16 Pain control: IV Tylenol 1 g  every 6; Toradol 30 mg IV every 6x3 doses; Robaxin 1 g IV every 8, morphine 2 mg IV x3 doses yesterday and 2 this a.m. oxycodone 5 mg x 2 yesterday and 1 this a.m.  Phenergan 12.5 mg IV x3 yesterday  Cecal volvulus S/pExploratory laparotomy, extended right colectomy Dr. Molli HazardMatthew Tsuei's, 06/07/2019 POD #10 - Post op ileus. Likely some component of opoid's in decreasing gut motility - plan to clamp and remove tube later today - clear liquids today - Minimize Narcotics  - Mobilize for bowel function  FEN -NPO, NGT, IVF, Replace K VTE -Lovenoxrestarted 06/15/19. Hgb stable x 5 days ID -Cefotetanperi-op, afebrile, WBC10.5 Foley: DC'd7/22 Follow up - Tsuei POC:  Austin [name], Austin [name]   [phone]  Austin [name],Austin [name] [phone]      Plan:  Clamp NG and if he does well take it out later this AM, decrease oxycodone, and put him back on oral Tylenol ibuprofen and Robaxin.  Restart his Adderall at half dose.  I will plan to take his staples out before he leaves he is postop day 10 now.  I will leave an order for full liquids in the morning.  If he does really well we might be able to get him out tomorrow.  He already has a postop appointment with Dr. Corliss Skainssuei on 06/27/2019.  I have talked with his [name] and told her if he continues to do  well he can go home perhaps on Saturday or Sunday.       LOS: 10 days    Austin [name] 06/17/2019 [phone]

## 2019-06-18 LAB — CBC
HCT: 37.3 % — ABNORMAL LOW (ref 39.0–52.0)
Hemoglobin: 12.3 g/dL — ABNORMAL LOW (ref 13.0–17.0)
MCH: 30.3 pg (ref 26.0–34.0)
MCHC: 33 g/dL (ref 30.0–36.0)
MCV: 91.9 fL (ref 80.0–100.0)
Platelets: 570 10*3/uL — ABNORMAL HIGH (ref 150–400)
RBC: 4.06 MIL/uL — ABNORMAL LOW (ref 4.22–5.81)
RDW: 13.3 % (ref 11.5–15.5)
WBC: 13.3 10*3/uL — ABNORMAL HIGH (ref 4.0–10.5)
nRBC: 0 % (ref 0.0–0.2)

## 2019-06-18 LAB — BASIC METABOLIC PANEL
Anion gap: 5 (ref 5–15)
BUN: 7 mg/dL (ref 6–20)
CO2: 23 mmol/L (ref 22–32)
Calcium: 8 mg/dL — ABNORMAL LOW (ref 8.9–10.3)
Chloride: 111 mmol/L (ref 98–111)
Creatinine, Ser: 0.76 mg/dL (ref 0.61–1.24)
GFR calc Af Amer: >60 mL/min (ref >60)
GFR calc non Af Amer: >60 mL/min (ref >60)
Glucose, Bld: 93 mg/dL (ref 70–99)
Potassium: 3.8 mmol/L (ref 3.5–5.1)
Sodium: 139 mmol/L (ref 135–145)

## 2019-06-18 MED ORDER — OXYCODONE HCL 5 MG PO TABS: 5.0000 mg | ORAL_TABLET | Freq: Four times a day (QID) | ORAL | 0 refills | Status: DC | PRN

## 2019-06-18 MED ORDER — ACETAMINOPHEN 500 MG PO TABS: ORAL_TABLET | ORAL | 0 refills | Status: DC

## 2019-06-18 MED ORDER — NICOTINE 14 MG/24HR TD PT24: MEDICATED_PATCH | TRANSDERMAL | 0 refills | Status: DC

## 2019-06-18 MED ORDER — IBUPROFEN 200 MG PO TABS: ORAL_TABLET | ORAL | Status: DC

## 2019-06-18 NOTE — Progress Notes (Signed)
11 Days Post-Op    CC: Abdominal pain  Subjective: He looks pretty good this morning.  He is tolerating full liquids.  He had injury last night multiple bowel movements.  Lungs are clear midline incision looks good.  Objective: Vital signs in last 24 hours: Temp:  [97.9 F (36.6 C)-98.3 F (36.8 C)] 98.1 F (36.7 C) (08/01 0438) Pulse Rate:  [75-79] 75 (08/01 0438) Resp:  [16-18] 18 (08/01 0438) BP: (113-117)/(67-70) 117/67 (08/01 0438) SpO2:  [97 %-100 %] 100 % (08/01 0438) Last BM Date: 06/18/19 1020 p.o. 2429 urine 400 recorded on NG prior to discontinuation BM x6 Afebrile vital signs are stable WBC is up-to-date at 13.3.  BMP is normal. Intake/Output from previous day: 07/31 0701 - 08/01 0700 In: 2429.5 [P.O.:1020; I.V.:1209.5; IV Piggyback:200] Out: 1700 [Urine:1300; Emesis/NG output:400] Intake/Output this shift: Total I/O In: 300 [P.O.:300] Out: -   General appearance: alert, cooperative and no distress Resp: clear to auscultation bilaterally GI: Soft, normal postop soreness.  Midline incision looks good.  Positive bowel sounds having multiple bowel movements.  Lab Results:  Recent Labs    06/16/19 0117 06/18/19 0312  WBC 9.1 13.3*  HGB 10.7* 12.3*  HCT 32.3* 37.3*  PLT 390 570*    BMET Recent Labs    06/16/19 0117 06/18/19 0312  NA 141 139  K 3.6 3.8  CL 103 111  CO2 31 23  GLUCOSE 101* 93  BUN 9 7  CREATININE 0.80 0.76  CALCIUM 7.9* 8.0*   PT/INR No results for input(s): LABPROT, INR in the last 72 hours.  Recent Labs  Lab 06/14/19 0244  AST 31  ALT 22  ALKPHOS 46  BILITOT 1.2  PROT 4.9*  ALBUMIN 2.8*     Lipase     Component Value Date/Time   LIPASE 28 06/07/2019 0600     Medications: . acetaminophen  1,000 mg Oral Q8H  . amphetamine-dextroamphetamine  10 mg Oral Q breakfast  . Chlorhexidine Gluconate Cloth  6 each Topical Daily  . enoxaparin (LOVENOX) injection  40 mg Subcutaneous Daily  . feeding supplement (ENSURE  ENLIVE)  237 mL Oral TID BM  . multivitamin with minerals  1 tablet Oral Daily  . nicotine  14 mg Transdermal Daily  . rOPINIRole  4 mg Oral QHS    Assessment/Plan Anemia-transfused 1 unit 7/24 Vapeuse/tobacco use/Kartom use ADHD-Adderall 20 mg daily, Moderate Malnutrition - prealbumin 16 Pain control:IV Tylenol 1 g every 6; Toradol 30 mg IV every 6x3 doses; Robaxin 1 g IV every 8, morphine 2 mg IV x3 doses yesterday and 2 this a.m. oxycodone 5 mg x 2 yesterday and 1 this a.m. Phenergan 12.5 mg IV x3 yesterday  Cecal volvulus S/pExploratory laparotomy, extended right colectomy Dr. Rodman Key Tsuei's, 06/07/2019 POD #11 - Post op ileus. Likely some component of opoid's in decreasing gut motility - plan to clamp and remove tube later today - clear liquids today - Minimize Narcotics  - Mobilize for bowel function  FEN -clears VTE -Lovenoxrestarted7/29/20. Hgb stable x 5 days ID -Cefotetanperi-op, afebrile, WBC10.5 Foley: DC'd7/22 Follow up - Tsuei POC:  [place], [place] Mother   [place] R Father [phone]      Plan: We will place him on a soft diet.  I will review this labs/elevated WBC with Dr. Windle Guard or Dr. Rosendo Gros.  He does well with a soft diet I think we could let him go home.  I will plan to have his staples removed prior to discharge.  LOS:  11 days    Sparrow Sanzo 06/18/2019 [phone]

## 2019-06-18 NOTE — Progress Notes (Signed)
Patient discharged to home. Verbalized understanding of all discharge instructions including incision care, diet, discharge medications and follow up MD visits.

## 2019-06-18 NOTE — Plan of Care (Signed)
Pain medication administered per orders 

## 2019-06-18 NOTE — Discharge Instructions (Signed)
[facility]      [facility] °[phone] ° °OPEN ABDOMINAL SURGERY: POST OP INSTRUCTIONS ° °Always review your discharge instruction sheet given to you by the facility where your surgery was performed. ° °IF YOU HAVE DISABILITY OR FAMILY LEAVE FORMS, YOU MUST BRING THEM TO THE OFFICE FOR PROCESSING.  PLEASE DO NOT GIVE THEM TO YOUR DOCTOR. ° °1. A prescription for pain medication may be given to you upon discharge.  Take your pain medication as prescribed, if needed.  If narcotic pain medicine is not needed, then you may take acetaminophen (Tylenol) or ibuprofen (Advil) as needed. °2. Take your usually prescribed medications unless otherwise directed. °3. If you need a refill on your pain medication, please contact your pharmacy. They will contact our office to request authorization.  Prescriptions will not be filled after 5pm or on week-ends. °4. You should follow a light diet the first few days after arrival home, such as soup and crackers, pudding, etc.unless your doctor has advised otherwise. A high-fiber, low fat diet can be resumed as tolerated.   Be sure to include lots of fluids daily. Most patients will experience some swelling and bruising on the chest and neck area.  Ice packs will help.  Swelling and bruising can take several days to resolve °5. Most patients will experience some swelling and bruising in the area of the incision. Ice pack will help. Swelling and bruising can take several days to resolve..  °6. It is common to experience some constipation if taking pain medication after surgery.  Increasing fluid intake and taking a stool softener will usually help or prevent this problem from occurring.  A mild laxative (Milk of Magnesia or Miralax) should be taken according to package directions if there are no bowel movements after 48 hours. °7.  You may have steri-strips (small skin tapes) in place directly over the incision.  These strips should be left on the skin for 7-10 days.  If your  surgeon used skin glue on the incision, you may shower in 24 hours.  The glue will flake off over the next 2-3 weeks.  Any sutures or staples will be removed at the office during your follow-up visit. You may find that a light gauze bandage over your incision may keep your staples from being rubbed or pulled. You may shower and replace the bandage daily. °8. ACTIVITIES:  You may resume regular (light) daily activities beginning the next day--such as daily self-care, walking, climbing stairs--gradually increasing activities as tolerated.  You may have sexual intercourse when it is comfortable.  Refrain from any heavy lifting or straining until approved by your doctor. °a. You may drive when you no longer are taking prescription pain medication, you can comfortably wear a seatbelt, and you can safely maneuver your car and apply brakes °b. Return to Work: ___________________________________ °9. You should see your doctor in the office for a follow-up appointment approximately two weeks after your surgery.  Make sure that you call for this appointment within a day or two after you arrive home to insure a convenient appointment time. °OTHER INSTRUCTIONS:  °_____________________________________________________________ °_____________________________________________________________ ° °WHEN TO CALL YOUR DOCTOR: °1. Fever over 101.0 °2. Inability to urinate °3. Nausea and/or vomiting °4. Extreme swelling or bruising °5. Continued bleeding from incision. °6. Increased pain, redness, or drainage from the incision. °7. Difficulty swallowing or breathing °8. Muscle cramping or spasms. °9. Numbness or tingling in hands or feet or around lips. ° °The clinic staff is available to   answer your questions during regular business hours.  Please dont hesitate to call and ask to speak to one of the nurses if you have concerns.  For further questions, please visit [email or web address]   Full Liquid Diet A full liquid diet  refers to fluids and foods that are liquid or will become liquid at room temperature. This diet should only be used for a short period of time to help you recover from illness or surgery. Your health care provider or dietitian will help you determine when it is safe to eat regular foods. What are tips for following this plan?     Reading food labels  Check food labels of nutrition shakes for the amount of protein. Look for nutrition shakes that have at least 8-10 grams of protein in each serving.  Look for drinks, such as milks and juices, that are "fortified" or "enriched." This means that vitamins and minerals have been added. Shopping  Buy premade nutritional shakes to keep on hand.  To vary your choices, buy different flavors of milks and shakes. Meal planning  Choose flavors and foods that you enjoy.  To make sure you get enough energy from food (calories): ? Eat 3 full liquid meals each day. Have a liquid snack between each meal. ? Drink 6-8 ounces (177-237 ml) of a nutrition supplement shake with meals or as snacks. ? Add protein powder, powdered milk, milk, or yogurt to shakes to increase the amount of protein.  Drink at least one serving a day of citrus fruit juice or fruit juice that has vitamin C added. General guidelines  Before starting the full-liquid diet, check with your health care provider to know what foods you should avoid. These may include full-fat or high-fiber liquids.  You may have any liquid or food that becomes a liquid at room temperature. The food is considered a liquid if it can be poured off a spoon at room temperature.  Do not drink alcohol unless approved by your health care provider.  This diet gives you most of the nutrients that you need for energy, but you may not get enough of certain vitamins, minerals, and fiber. Make sure to talk to your health care provider or dietitian about: ? How many calories you need to eat get day. ? How much fluid  you should have each day. ? Taking a multivitamin or a nutritional supplement. What foods are allowed? The items listed may not be a complete list. Talk with your dietitian about what dietary choices are best for you. Grains Thin hot cereal, such as cream of wheat. Soft-cooked pasta or rice pured in soup. Vegetables Pulp-free tomato or vegetable juice. Vegetables pured in soup. Fruits Fruit juice without pulp. Strained fruit pures (seeds and skins removed). Meats and other protein foods Beef, chicken, and fish broths. Powdered protein supplements. Dairy Milk and milk-based beverages, including milk shakes and instant breakfast mixes. Smooth yogurt. Pured cottage cheese. Beverages Water. Coffee and tea (caffeinated or decaffeinated). Cocoa. Liquid nutritional supplements. Soft drinks. Nondairy milks, such as almond, coconut, rice, or soy milk. Fats and oils Melted margarine and butter. Cream. Canola, almond, avocado, corn, grapeseed, sunflower, and sesame oils. Gravy. Sweets and desserts Custard. Pudding. Flavored gelatin. Smooth ice cream (without nuts or candy pieces). Sherbet. Popsicles. [place] ice. Pudding pops. Seasoning and other foods Salt and pepper. Spices. Cocoa powder. Vinegar. Ketchup. Yellow mustard. Smooth sauces, such as [place], cheese sauce, or white sauce. Soy sauce. Cream soups. Strained soups. Syrup. Honey. Jelly (without fruit  pieces). What foods are not allowed? The items listed may not be a complete list. Talk with your dietitian about what dietary choices are best for you. Grains Whole grains. Pasta. Rice. Cold cereal. Bread. Crackers. Vegetables All whole fresh, frozen, or canned vegetables. Fruits All whole fresh, frozen, or canned fruits. Meats and other protein foods All cuts of meat, poultry, and fish. Eggs. Tofu and soy protein. Nuts and nut butters. Lunch meat. Sausage. Dairy Hard cheese. Yogurt with fruit chunks. Fats and oils Coconut oil.  Palm oil. Lard. Cold butter. Sweets and desserts Ice cream or other frozen desserts that have any solids in them or on top, such as nuts, chocolate chips, and pieces of cookies. Cakes. Cookies. Candy. Seasoning and other foods Stone-ground mustards. Soups with chunks or pieces. Summary  A full liquid diet refers to fluids and foods that are liquid or will become liquid at room temperature.  This diet should only be used for a short period of time to help you recover from illness or surgery. Ask your health care provider or dietitian when it is safe for you to eat regular foods.  To make sure you get enough calories and nutrients, eat 3 meals each day with snacks between. Drink premade nutrition supplement shakes or add protein powder to homemade shakes. Take a vitamin and mineral supplement as told by your health care provider. This information is not intended to replace advice given to you by your health care provider. Make sure you discuss any questions you have with your health care provider. Document Released: 11/03/2005 Document Revised: 01/30/2018 Document Reviewed: 12/17/2016 Elsevier Patient Education  2020 [employer]   Soft-Food Eating Plan A soft-food eating plan includes foods that are safe and easy to chew and swallow. Your health care provider or dietitian can help you find foods and flavors that fit into this plan. Follow this plan until your health care provider or dietitian says it is safe to start eating other foods and food textures. What are tips for following this plan? General guidelines   Take small bites of food, or cut food into pieces about  inch or smaller. Bite-sized pieces of food are easier to chew and swallow.  Eat moist foods. Avoid overly dry foods.  Avoid foods that: ? Are difficult to swallow, such as dry, chunky, crispy, or sticky foods. ? Are difficult to chew, such as hard, tough, or stringy foods. ? Contain nuts, seeds, or fruits.  Follow  instructions from your dietitian about the types of liquids that are safe for you to swallow. You may be allowed to have: ? Thick liquids only. This includes only liquids that are thicker than honey. ? Thin and thick liquids. This includes all beverages and foods that become liquid at room temperature.  To make thick liquids: ? Purchase a commercial liquid thickening powder. These are available at grocery stores and pharmacies. ? Mix the thickener into liquids according to instructions on the label. ? Purchase ready-made thickened liquids. ? Thicken soup by pureeing, straining to remove chunks, and adding flour, potato flakes, or corn starch. ? Add commercial thickener to foods that become liquid at room temperature, such as milk shakes, yogurt, ice cream, gelatin, and sherbet.  Ask your health care provider whether you need to take a fiber supplement. Cooking  Cook meats so they stay tender and moist. Use methods like braising, stewing, or baking in liquid.  Cook vegetables and fruit until they are soft enough to be mashed with a  fork.  Peel soft, fresh fruits such as peaches, nectarines, and melons.  When making soup, make sure chunks of meat and vegetables are smaller than  inch.  Reheat leftover foods slowly so that a tough crust does not form. What foods are allowed? The items listed below may not be a complete list. Talk with your dietitian about what dietary choices are best for you. Grains Breads, muffins, pancakes, or waffles moistened with syrup, jelly, or butter. Dry cereals well-moistened with milk. Moist, cooked cereals. Well-cooked pasta and rice. Vegetables All soft-cooked vegetables. Shredded lettuce. Fruits All canned and cooked fruits. Soft, peeled fresh fruits. Strawberries. Dairy Milk. Cream. Yogurt. Cottage cheese. Soft cheese without the rind. Meats and other protein foods Tender, moist ground meat, poultry, or fish. Meat cooked in gravy or sauces.  Eggs. Sweets and desserts Ice cream. Milk shakes. Sherbet. Pudding. Fats and oils Butter. Margarine. Olive, canola, sunflower, and grapeseed oil. Smooth salad dressing. Smooth cream cheese. Mayonnaise. Gravy. What foods are not allowed? The items listed bemay not be a complete list. Talk with your dietitian about what dietary choices are best for you. Grains Coarse or dry cereals, such as bran, granola, and shredded wheat. Tough or chewy crusty breads, such as [place] bread or baguettes. Breads with nuts, seeds, or fruit. Vegetables All raw vegetables. Cooked corn. Cooked vegetables that are tough or stringy. Tough, crisp, fried potatoes and potato skins. Fruits Fresh fruits with skins or seeds, or both, such as apples, pears, and grapes. Stringy, high-pulp fruits, such as papaya, pineapple, coconut, and mango. Fruit leather and all dried fruit. Dairy Yogurt with nuts or coconut. Meats and other protein foods Hard, dry sausages. Dry meat, poultry, or fish. Meats with gristle. Fish with bones. Fried meat or fish. Lunch meat and hotdogs. Nuts and seeds. Chunky peanut butter or other nut butters. Sweets and desserts Cakes or cookies that are very dry or chewy. Desserts with dried fruit, nuts, or coconut. Fried pastries. Very rich pastries. Fats and oils Cream cheese with fruit or nuts. Salad dressings with seeds or chunks. Summary  A soft-food eating plan includes foods that are safe and easy to swallow. Generally, the foods should be soft enough to be mashed with a fork.  Avoid foods that are dry, hard to chew, crunchy, sticky, stringy, or crispy.  Ask your health care provider whether you need to thicken your liquids and if you need to take a fiber supplement. This information is not intended to replace advice given to you by your health care provider. Make sure you discuss any questions you have with your health care provider. Document Released: 02/10/2008 Document Revised: 02/24/2019  Document Reviewed: 01/06/2017 Elsevier Patient Education  2020 [employer].   Coping with Quitting Smoking You off the cigarettes now so this can help you stay off.  Quitting smoking is a physical and mental challenge. You will face cravings, withdrawal symptoms, and temptation. Before quitting, work with your health care provider to make a plan that can help you cope. Preparation can help you quit and keep you from giving in. How can I cope with cravings? Cravings usually last for 5-10 minutes. If you get through it, the craving will pass. Consider taking the following actions to help you cope with cravings:  Keep your mouth busy: ? Chew sugar-free gum. ? Suck on hard candies or a straw. ? Brush your teeth.  Keep your hands and body busy: ? Immediately change to a different activity when you feel a craving. ?  Squeeze or play with a ball. ? Do an activity or a hobby, like making bead jewelry, practicing needlepoint, or working with wood. ? Mix up your normal routine. ? Take a short exercise break. Go for a quick walk or run up and down stairs. ? Spend time in public places where smoking is not allowed.  Focus on doing something kind or helpful for someone else.  Call a friend or family member to talk during a craving.  Join a support group.  Call a quit line, such as 1-800-QUIT-NOW.  Talk with your health care provider about medicines that might help you cope with cravings and make quitting easier for you. How can I deal with withdrawal symptoms? Your body may experience negative effects as it tries to get used to not having nicotine in the system. These effects are called withdrawal symptoms. They may include:  Feeling hungrier than normal.  Trouble concentrating.  Irritability.  Trouble sleeping.  Feeling depressed.  Restlessness and agitation.  Craving a cigarette. To manage withdrawal symptoms:  Avoid places, people, and activities that trigger your  cravings.  Remember why you want to quit.  Get plenty of sleep.  Avoid coffee and other caffeinated drinks. These may worsen some of your symptoms. How can I handle social situations? Social situations can be difficult when you are quitting smoking, especially in the first few weeks. To manage this, you can:  Avoid parties, bars, and other social situations where people might be smoking.  Avoid alcohol.  Leave right away if you have the urge to smoke.  Explain to your family and friends that you are quitting smoking. Ask for understanding and support.  Plan activities with friends or family where smoking is not an option. What are some ways I can cope with stress? Wanting to smoke may cause stress, and stress can make you want to smoke. Find ways to manage your stress. Relaxation techniques can help. For example:  Breathe slowly and deeply, in through your nose and out through your mouth.  Listen to soothing, relaxing music.  Talk with a family member or friend about your stress.  Light a candle.  Soak in a bath or take a shower.  Think about a peaceful place. What are some ways I can prevent weight gain? Be aware that many people gain weight after they quit smoking. However, not everyone does. To keep from gaining weight, have a plan in place before you quit and stick to the plan after you quit. Your plan should include:  Having healthy snacks. When you have a craving, it may help to: ? Eat plain popcorn, crunchy carrots, celery, or other cut vegetables. ? Chew sugar-free gum.  Changing how you eat: ? Eat small portion sizes at meals. ? Eat 4-6 small meals throughout the day instead of 1-2 large meals a day. ? Be mindful when you eat. Do not watch television or do other things that might distract you as you eat.  Exercising regularly: ? Make time to exercise each day. If you do not have time for a long workout, do short bouts of exercise for 5-10 minutes several times a  day. ? Do some form of strengthening exercise, like weight lifting, and some form of aerobic exercise, like running or swimming.  Drinking plenty of water or other low-calorie or no-calorie drinks. Drink 6-8 glasses of water daily, or as much as instructed by your health care provider. Summary  Quitting smoking is a physical and mental challenge. You  will face cravings, withdrawal symptoms, and temptation to smoke again. Preparation can help you as you go through these challenges.  You can cope with cravings by keeping your mouth busy (such as by chewing gum), keeping your body and hands busy, and making calls to family, friends, or a helpline for people who want to quit smoking.  You can cope with withdrawal symptoms by avoiding places where people smoke, avoiding drinks with caffeine, and getting plenty of rest.  Ask your health care provider about the different ways to prevent weight gain, avoid stress, and handle social situations. This information is not intended to replace advice given to you by your health care provider. Make sure you discuss any questions you have with your health care provider. Document Released: 10/31/2016 Document Revised: 10/16/2017 Document Reviewed: 10/31/2016 Elsevier Patient Education  2020 [employer].

## 2019-06-18 NOTE — Progress Notes (Signed)
Orders to remove staples prior to discharge. Educated patient on procedure. 16 staples removed from abdominal incision via sterile technique. Applied benzoin and steri-strips to incision. Incision remains clean and well approximated. Patient tolerated procedure well. Will continue to monitor.

## 2019-08-11 ENCOUNTER — Other Ambulatory Visit: Payer: Self-pay

## 2019-08-11 ENCOUNTER — Ambulatory Visit (INDEPENDENT_AMBULATORY_CARE_PROVIDER_SITE_OTHER): Payer: Commercial Managed Care - PPO | Admitting: Adult Health Nurse Practitioner

## 2019-08-11 ENCOUNTER — Encounter (INDEPENDENT_AMBULATORY_CARE_PROVIDER_SITE_OTHER): Payer: Self-pay

## 2019-08-11 VITALS — BP 96/50 | HR 106 | Temp 97.2°F | Resp 16 | Wt 120.4 lb

## 2019-08-11 DIAGNOSIS — R5383 Other fatigue: Secondary | ICD-10-CM | POA: Diagnosis not present

## 2019-08-11 DIAGNOSIS — F329 Major depressive disorder, single episode, unspecified: Secondary | ICD-10-CM | POA: Diagnosis not present

## 2019-08-11 DIAGNOSIS — F419 Anxiety disorder, unspecified: Secondary | ICD-10-CM | POA: Diagnosis not present

## 2019-08-11 MED ORDER — ESCITALOPRAM OXALATE 10 MG PO TABS: 10.0000 mg | ORAL_TABLET | Freq: Every day | ORAL | 2 refills | Status: DC

## 2019-08-11 MED ORDER — BUSPIRONE HCL 10 MG PO TABS: ORAL_TABLET | ORAL | 1 refills | Status: DC

## 2019-08-11 NOTE — Progress Notes (Signed)
Assessment and Plan:  Isahi was seen today for advice only.  Diagnoses and all orders for this visit:  Fatigue, unspecified type Discussed dietary intake and exercise modifications-     CBC with Differential/Platelet -     COMPLETE METABOLIC PANEL WITH GFR -     VITAMIN D 25 Hydroxy (Vit-D Deficiency, Fractures) -     Vitamin B12 -     TSH  Reactive depression Discussed medication and side effects Discussed letting family know of new medication start Discussed increasing activity, interests/hobbies -     escitalopram (LEXAPRO) 10 MG tablet; Take 1 tablet (10 mg total) by mouth daily. -     Ambulatory referral to Psychiatry  Anxiety Discussed stress management techniques   Discussed good sleep hygiene Discussed increasing physical activity and exercise Increase water intake -     busPIRone (BUSPAR) 10 MG tablet; Take one tablet by mouth three times AS NEEDED for anxiety. -     Ambulatory referral to Psychiatry -Patient asked for benzodiazepine, stating doctor in hospital said this would help since he was taking Kratom supplement to help with this.  Did not disclose this during the initial interview.  -Discussed not taking this supplement while starting new medications to decipher side effects.  Has been taking for less than 6 months. Discussed side effects as reported per [facility] can include depression. -Per evaluation today it does not warrant benzodiazapine treatment at this time.  Follow up in 4-6 week on new medication start.  If able to see Psychiatry prior, may take over med management?  Contact office with any new or worsening symptoms.      Further disposition pending results of labs. Discussed med's effects and SE's.   Over 30 minutes of exam, counseling, chart review, and critical decision making was performed.   Future Appointments  Date Time Provider Department Center  09/15/2019 10:00 AM Elder Negus, NP [facility] None     ------------------------------------------------------------------------------------------------------------------   HPI  28 y.o.male presents for  Evaluation of depression and reports that his father wanted him to come in to the office today.  He works as a [employer] certified accountant for about 5 years and reports he does not like his job.  He works as a Chart[employer] certified accountant and does the same thing everyday and he reports he does not like going to work.  His hours are1500-0200 currently.  Reports he has job interview schedule next week for another Chartered certified accountant job where his mother works.  He reports he is looking forward to this.   He has tried the Wellbutrin XL in the past with adverse side effects of negative vivid dreams and did not want to continue taking this.   06/07/19- He presented to ED severe abdominal pain which results in bowel resection. He had prior double hernia repair.  Then approximately  He has had follow up with Dr Fleeta Emmer for this..  Reports   He reports 5 days ago his dog passes away five days ago.  He reports that he was very close with the dog and has been taking this hard.  Reports the the dog got bit by a snake and she was taken to the vet at recommended supportive care.  The dog stopped eating and seemed in pain and took her back to vet and she need up passing.  It ended up that the dog was bite by a brown recluse.  Reports he work 4, 10 hour days a week.  He reports he has just wanted to sleep.  He  has a house but has been staying with his Mom after the surgery.  Yesterday was his first day back at work.  Reports he does not have a large social group.  He has some friends from work and occasionally hang out with ihs brother.  His interests include playing video games.  He reports he has a large appetite.  Reports he had a buscuit this morning, he always has oatmeal for at least one meal . He eats lots of fish in his diet.  He will eat grilled salmon often.  He does not eat any sides with this but he will  eat a kale spinach smoothie during the day as another snack/meal.   Past Medical History:  Diagnosis Date  . ADD (attention deficit disorder)      No Known Allergies  Current Outpatient Medications on File Prior to Visit  Medication Sig  . acetaminophen (TYLENOL) 500 MG tablet Tylenol 1000 mg every 8 hours as needed for pain.  You can alternate this with ibuprofen.  Do not exceed 4000 mg of Tylenol per day.  You can buy this over-the-counter at any drugstore.  Marland Kitchen amphetamine-dextroamphetamine (ADDERALL) 20 MG tablet Take 20 mg by mouth daily.  . Polyethylene Glycol 3350 (MIRALAX PO) Take by mouth. Take as needed.  . psyllium (METAMUCIL) 58.6 % powder Take 1 packet by mouth daily.   No current facility-administered medications on file prior to visit.     ROS: all negative except above.   Physical Exam:  BP (!) 96/50   Pulse (!) 106   Temp (!) 97.2 F (36.2 C)   Resp 16   Wt 120 lb 6.4 oz (54.6 kg)   BMI 16.79 kg/m   General Appearance: Pale, in no apparent distress. Eyes: PERRLA, EOMs, conjunctiva no swelling or erythema Sinuses: No Frontal/maxillary tenderness ENT/Mouth: Ext aud canals clear, TMs without erythema, bulging. No erythema, swelling, or exudate on post pharynx.  Tonsils not swollen or erythematous. Hearing normal.  Neck: Supple, thyroid normal.  Respiratory: Respiratory effort normal, BS equal bilaterally without rales, rhonchi, wheezing or stridor.  Cardio: RRR with no MRGs. Brisk peripheral pulses without edema.  Abdomen: Soft, + BS.  Non tender, no guarding, rebound, hernias, masses. Lymphatics: Non tender without lymphadenopathy.  Musculoskeletal: Full ROM, 5/5 strength, normal gait.  Skin: Warm, dry without rashes, lesions, ecchymosis.  Neuro: Cranial nerves intact. Normal muscle tone, no cerebellar symptoms. Sensation intact.  Psych: Awake and oriented X 3, normal affect, Insight and Judgment appropriate.     Garnet Sierras, NP 11:28 AM [facility]  [facility]

## 2019-08-11 NOTE — Patient Instructions (Addendum)
Today we have placed a referral for Psychiatry services.  Please review the list provided and call to schedule an appointment.  Please let us know  The provider you choose by calling the office or contacting us through MyChart.  We are going to start you on Lexapro 10mg  once a day.  Take this at night(when YOU go to bed).   Follow up with me in 4-6 weeks.  If you are about to see Psychiatry before this you may not need to follow up with me.    Please let those around you know you are starting a new medication.  Contact the office with any new or worsening symptoms.  Below is some general imformation about depression Generalized Anxiety Disorder, Adult Generalized anxiety disorder (GAD) is a mental health disorder. People with this condition constantly worry about everyday events. Unlike normal anxiety, worry related to GAD is not triggered by a specific event. These worries also do not fade or get better with time. GAD interferes with life functions, including relationships, work, and school. GAD can vary from mild to severe. People with severe GAD can have intense waves of anxiety with physical symptoms (panic attacks). What are the causes? The exact cause of GAD is not known. What increases the risk? This condition is more likely to develop in:  Women.  People who have a family history of anxiety disorders.  People who are very shy.  People who experience very stressful life events, such as the death of a loved one.  People who have a very stressful family environment. What are the signs or symptoms? People with GAD often worry excessively about many things in their lives, such as their health and family. They may also be overly concerned about:  Doing well at work.  Being on time.  Natural disasters.  Friendships. Physical symptoms of GAD include:  Fatigue.  Muscle tension or having muscle twitches.  Trembling or feeling shaky.  Being easily startled.  Feeling like  your heart is pounding or racing.  Feeling out of breath or like you cannot take a deep breath.  Having trouble falling asleep or staying asleep.  Sweating.  Nausea, diarrhea, or irritable bowel syndrome (IBS).  Headaches.  Trouble concentrating or remembering facts.  Restlessness.  Irritability. How is this diagnosed? Your health care provider can diagnose GAD based on your symptoms and medical history. You will also have a physical exam. The health care provider will ask specific questions about your symptoms, including how severe they are, when they started, and if they come and go. Your health care provider may ask you about your use of alcohol or drugs, including prescription medicines. Your health care provider may refer you to a mental health specialist for further evaluation. Your health care provider will do a thorough examination and may perform additional tests to rule out other possible causes of your symptoms. To be diagnosed with GAD, a person must have anxiety that:  Is out of his or her control.  Affects several different aspects of his or her life, such as work and relationships.  Causes distress that makes him or her unable to take part in normal activities.  Includes at least three physical symptoms of GAD, such as restlessness, fatigue, trouble concentrating, irritability, muscle tension, or sleep problems. Before your health care provider can confirm a diagnosis of GAD, these symptoms must be present more days than they are not, and they must last for six months or longer. How is this treated?  The following therapies are usually used to treat GAD:  Medicine. Antidepressant medicine is usually prescribed for long-term daily control. Antianxiety medicines may be added in severe cases, especially when panic attacks occur.  Talk therapy (psychotherapy). Certain types of talk therapy can be helpful in treating GAD by providing support, education, and guidance.  Options include: ? Cognitive behavioral therapy (CBT). People learn coping skills and techniques to ease their anxiety. They learn to identify unrealistic or negative thoughts and behaviors and to replace them with positive ones. ? Acceptance and commitment therapy (ACT). This treatment teaches people how to be mindful as a way to cope with unwanted thoughts and feelings. ? Biofeedback. This process trains you to manage your body's response (physiological response) through breathing techniques and relaxation methods. You will work with a therapist while machines are used to monitor your physical symptoms.  Stress management techniques. These include yoga, meditation, and exercise. A mental health specialist can help determine which treatment is best for you. Some people see improvement with one type of therapy. However, other people require a combination of therapies. Follow these instructions at home:  Take over-the-counter and prescription medicines only as told by your health care provider.  Try to maintain a normal routine.  Try to anticipate stressful situations and allow extra time to manage them.  Practice any stress management or self-calming techniques as taught by your health care provider.  Do not punish yourself for setbacks or for not making progress.  Try to recognize your accomplishments, even if they are small.  Keep all follow-up visits as told by your health care provider. This is important. Contact a health care provider if:  Your symptoms do not get better.  Your symptoms get worse.  You have signs of depression, such as: ? A persistently sad, cranky, or irritable mood. ? Loss of enjoyment in activities that used to bring you joy. ? Change in weight or eating. ? Changes in sleeping habits. ? Avoiding friends or family members. ? Loss of energy for normal tasks. ? Feelings of guilt or worthlessness. Get help right away if:  You have serious thoughts about  hurting yourself or others. If you ever feel like you may hurt yourself or others, or have thoughts about taking your own life, get help right away. You can go to your nearest emergency department or call:  Your local emergency services (911 in the [place]  A suicide crisis helpline, such as the National Suicide Prevention Lifeline at 205 467 6497. This is open 24 hours a day. Summary  Generalized anxiety disorder (GAD) is a mental health disorder that involves worry that is not triggered by a specific event.  People with GAD often worry excessively about many things in their lives, such as their health and family.  GAD may cause physical symptoms such as restlessness, trouble concentrating, sleep problems, frequent sweating, nausea, diarrhea, headaches, and trembling or muscle twitching.  A mental health specialist can help determine which treatment is best for you. Some people see improvement with one type of therapy. However, other people require a combination of therapies. This information is not intended to replace advice given to you by your health care provider. Make sure you discuss any questions you have with your health care provider. Document Released: 02/28/2013 Document Revised: 10/16/2017 Document Reviewed: 09/23/2016 Elsevier Patient Education  2020 [employer]  Living With Depression Everyone experiences occasional disappointment, sadness, and loss in their lives. When you are feeling down, blue, or sad for at least 2  weeks in a row, it may mean that you have depression. Depression can affect your thoughts and feelings, relationships, daily activities, and physical health. It is caused by changes in the way your brain functions. If you receive a diagnosis of depression, your health care provider will tell you which type of depression you have and what treatment options are available to you. If you are living with depression, there are ways to help you recover from it and also  ways to prevent it from coming back. How to cope with lifestyle changes Coping with stress     Stress is your body's reaction to life changes and events, both good and bad. Stressful situations may include:  Getting married.  The death of a spouse.  Losing a job.  Retiring.  Having a baby. Stress can last just a few hours or it can be ongoing. Stress can play a major role in depression, so it is important to learn both how to cope with stress and how to think about it differently. Talk with your health care provider or a counselor if you would like to learn more about stress reduction. He or she may suggest some stress reduction techniques, such as:  Music therapy. This can include creating music or listening to music. Choose music that you enjoy and that inspires you.  Mindfulness-based meditation. This kind of meditation can be done while sitting or walking. It involves being aware of your normal breaths, rather than trying to control your breathing.  Centering prayer. This is a kind of meditation that involves focusing on a spiritual word or phrase. Choose a word, phrase, or sacred image that is meaningful to you and that brings you peace.  Deep breathing. To do this, expand your stomach and inhale slowly through your nose. Hold your breath for 3-5 seconds, then exhale slowly, allowing your stomach muscles to relax.  Muscle relaxation. This involves intentionally tensing muscles then relaxing them. Choose a stress reduction technique that fits your lifestyle and personality. Stress reduction techniques take time and practice to develop. Set aside 5-15 minutes a day to do them. Therapists can offer training in these techniques. The training may be covered by some insurance plans. Other things you can do to manage stress include:  Keeping a stress diary. This can help you learn what triggers your stress and ways to control your response.  Understanding what your limits are and  saying no to requests or events that lead to a schedule that is too full.  Thinking about how you respond to certain situations. You may not be able to control everything, but you can control how you react.  Adding humor to your life by watching funny films or TV shows.  Making time for activities that help you relax and not feeling guilty about spending your time this way.  Medicines Your health care provider may suggest certain medicines if he or she feels that they will help improve your condition. Avoid using alcohol and other substances that may prevent your medicines from working properly (may interact). It is also important to:  Talk with your pharmacist or health care provider about all the medicines that you take, their possible side effects, and what medicines are safe to take together.  Make it your goal to take part in all treatment decisions (shared decision-making). This includes giving input on the side effects of medicines. It is best if shared decision-making with your health care provider is part of your total treatment plan. If  your health care provider prescribes a medicine, you may not notice the full benefits of it for 4-8 weeks. Most people who are treated for depression need to be on medicine for at least 6-12 months after they feel better. If you are taking medicines as part of your treatment, do not stop taking medicines without first talking to your health care provider. You may need to have the medicine slowly decreased (tapered) over time to decrease the risk of harmful side effects. Relationships Your health care provider may suggest family therapy along with individual therapy and drug therapy. While there may not be family problems that are causing you to feel depressed, it is still important to make sure your family learns as much as they can about your mental health. Having your family's support can help make your treatment successful. How to recognize changes in  your condition Everyone has a different response to treatment for depression. Recovery from major depression happens when you have not had signs of major depression for two months. This may mean that you will start to:  Have more interest in doing activities.  Feel less hopeless than you did 2 months ago.  Have more energy.  Overeat less often, or have better or improving appetite.  Have better concentration. Your health care provider will work with you to decide the next steps in your recovery. It is also important to recognize when your condition is getting worse. Watch for these signs:  Having fatigue or low energy.  Eating too much or too little.  Sleeping too much or too little.  Feeling restless, agitated, or hopeless.  Having trouble concentrating or making decisions.  Having unexplained physical complaints.  Feeling irritable, angry, or aggressive. Get help as soon as you or your family members notice these symptoms coming back. How to get support and help from others How to talk with friends and family members about your condition  Talking to friends and family members about your condition can provide you with one way to get support and guidance. Reach out to trusted friends or family members, explain your symptoms to them, and let them know that you are working with a health care provider to treat your depression. Financial resources Not all insurance plans cover mental health care, so it is important to check with your insurance carrier. If paying for co-pays or counseling services is a problem, search for a local or county mental health care center. They may be able to offer public mental health care services at low or no cost when you are not able to see a private health care provider. If you are taking medicine for depression, you may be able to get the generic form, which may be less expensive. Some makers of prescription medicines also offer help to patients who  cannot afford the medicines they need. Follow these instructions at home:   Get the right amount and quality of sleep.  Cut down on using caffeine, tobacco, alcohol, and other potentially harmful substances.  Try to exercise, such as walking or lifting small weights.  Take over-the-counter and prescription medicines only as told by your health care provider.  Eat a healthy diet that includes plenty of vegetables, fruits, whole grains, low-fat dairy products, and lean protein. Do not eat a lot of foods that are high in solid fats, added sugars, or salt.  Keep all follow-up visits as told by your health care provider. This is important. Contact a health care provider if:  You stop  taking your antidepressant medicines, and you have any of these symptoms: ? Nausea. ? Headache. ? Feeling lightheaded. ? Chills and body aches. ? Not being able to sleep (insomnia).  You or your friends and family think your depression is getting worse. Get help right away if:  You have thoughts of hurting yourself or others. If you ever feel like you may hurt yourself or others, or have thoughts about taking your own life, get help right away. You can go to your nearest emergency department or call:  Your local emergency services (911 in the [place]  A suicide crisis helpline, such as the National Suicide Prevention Lifeline at 2675177638. This is open 24-hours a day. Summary  If you are living with depression, there are ways to help you recover from it and also ways to prevent it from coming back.  Work with your health care team to create a management plan that includes counseling, stress management techniques, and healthy lifestyle habits. This information is not intended to replace advice given to you by your health care provider. Make sure you discuss any questions you have with your health care provider. Document Released: 10/06/2016 Document Revised: 02/25/2019 Document Reviewed:  10/06/2016 Elsevier Patient Education  2020 [employer].  and anxiety.

## 2019-08-12 LAB — COMPLETE METABOLIC PANEL WITH GFR
AG Ratio: 2.4 (calc) (ref 1.0–2.5)
ALT: 9 U/L (ref 9–46)
AST: 14 U/L (ref 10–40)
Albumin: 4.3 g/dL (ref 3.6–5.1)
Alkaline phosphatase (APISO): 76 U/L (ref 36–130)
BUN: 7 mg/dL (ref 7–25)
CO2: 33 mmol/L — ABNORMAL HIGH (ref 20–32)
Calcium: 9.6 mg/dL (ref 8.6–10.3)
Chloride: 102 mmol/L (ref 98–110)
Creat: 1.09 mg/dL (ref 0.60–1.35)
GFR, Est African American: 106 mL/min/{1.73_m2} (ref >=60)
GFR, Est Non African American: 91 mL/min/{1.73_m2} (ref >=60)
Globulin: 1.8 g/dL (calc) — ABNORMAL LOW (ref 1.9–3.7)
Glucose, Bld: 98 mg/dL (ref 65–99)
Potassium: 3.9 mmol/L (ref 3.5–5.3)
Sodium: 143 mmol/L (ref 135–146)
Total Bilirubin: 0.9 mg/dL (ref 0.2–1.2)
Total Protein: 6.1 g/dL (ref 6.1–8.1)

## 2019-08-12 LAB — CBC WITH DIFFERENTIAL/PLATELET
Absolute Monocytes: 633 cells/uL (ref 200–950)
Basophils Absolute: 68 cells/uL (ref 0–200)
Basophils Relative: 1.2 %
Eosinophils Absolute: 171 cells/uL (ref 15–500)
Eosinophils Relative: 3 %
HCT: 39.6 % (ref 38.5–50.0)
Hemoglobin: 13.3 g/dL (ref 13.2–17.1)
Lymphs Abs: 1248 cells/uL (ref 850–3900)
MCH: 30.5 pg (ref 27.0–33.0)
MCHC: 33.6 g/dL (ref 32.0–36.0)
MCV: 90.8 fL (ref 80.0–100.0)
MPV: 11.5 fL (ref 7.5–12.5)
Monocytes Relative: 11.1 %
Neutro Abs: 3580 cells/uL (ref 1500–7800)
Neutrophils Relative %: 62.8 %
Platelets: 233 10*3/uL (ref 140–400)
RBC: 4.36 10*6/uL (ref 4.20–5.80)
RDW: 13.3 % (ref 11.0–15.0)
Total Lymphocyte: 21.9 %
WBC: 5.7 10*3/uL (ref 3.8–10.8)

## 2019-08-12 LAB — VITAMIN D 25 HYDROXY (VIT D DEFICIENCY, FRACTURES): Vit D, 25-Hydroxy: 26 ng/mL — ABNORMAL LOW (ref 30–100)

## 2019-08-12 LAB — TSH: TSH: 0.78 mIU/L (ref 0.40–4.50)

## 2019-08-12 LAB — VITAMIN B12: Vitamin B-12: 954 pg/mL (ref 200–1100)

## 2019-08-15 ENCOUNTER — Encounter: Payer: Self-pay | Admitting: Adult Health Nurse Practitioner

## 2019-08-15 ENCOUNTER — Other Ambulatory Visit: Payer: Self-pay | Admitting: Adult Health Nurse Practitioner

## 2019-08-15 DIAGNOSIS — E559 Vitamin D deficiency, unspecified: Secondary | ICD-10-CM

## 2019-08-15 MED ORDER — CHOLECALCIFEROL 1.25 MG (50000 UT) PO CAPS: ORAL_CAPSULE | ORAL | 0 refills | Status: DC

## 2019-09-14 NOTE — Progress Notes (Deleted)
Follow up, new med start   Assessment and Plan:  Selden was seen today for advice only.  Diagnoses and all orders for this visit:  Fatigue, unspecified type Discussed dietary intake and exercise modifications-     CBC with Differential/Platelet -     COMPLETE METABOLIC PANEL WITH GFR -     VITAMIN D 25 Hydroxy (Vit-D Deficiency, Fractures) -     Vitamin B12 -     TSH  Reactive depression Discussed medication and side effects Discussed letting family know of new medication start Discussed increasing activity, interests/hobbies -     escitalopram (LEXAPRO) 10 MG tablet; Take 1 tablet (10 mg total) by mouth daily. -     Ambulatory referral to Psychiatry  Anxiety Discussed stress management techniques   Discussed good sleep hygiene Discussed increasing physical activity and exercise Increase water intake -     busPIRone (BUSPAR) 10 MG tablet; Take one tablet by mouth three times AS NEEDED for anxiety. -     Ambulatory referral to Psychiatry -Patient asked for benzodiazepine, stating doctor in hospital said this would help since he was taking Kratom supplement to help with this.  Did not disclose this during the initial interview.  -Discussed not taking this supplement while starting new medications to decipher side effects.  Has been taking for less than 6 months. Discussed side effects as reported per [facility] can include depression. -Per evaluation today it does not warrant benzodiazapine treatment at this time.  Follow up in 4-6 week on new medication start.  If able to see Psychiatry prior, may take over med management?  Contact office with any new or worsening symptoms.      Further disposition pending results of labs. Discussed med's effects and SE's.   Over 30 minutes of exam, counseling, chart review, and critical decision making was performed.   Future Appointments  Date Time Provider [place]  09/15/2019 10:00 AM Garnet Sierras, NP [facility] None     ------------------------------------------------------------------------------------------------------------------   HPI  28 y.o.male presents for  Evaluation of depression and reports that his father wanted him to come in to the office today.  He works as a Furniture conservator/restorer for about 5 years and reports he does not like his job.  He works as a Furniture conservator/restorer and does the same thing everyday and he reports he does not like going to work.  His hours are1500-0200 currently.  Reports he has job interview schedule next week for another Furniture conservator/restorer job where his mother works.  He reports he is looking forward to this.   He has tried the Wellbutrin XL in the past with adverse side effects of negative vivid dreams and did not want to continue taking this.   06/07/19- He presented to ED severe abdominal pain which results in bowel resection. He had prior double hernia repair.  Then approximately  He has had follow up with Dr Evelena Peat for this..  Reports   He reports 5 days ago his dog passes away five days ago.  He reports that he was very close with the dog and has been taking this hard.  Reports the the dog got bit by a snake and she was taken to the vet at recommended supportive care.  The dog stopped eating and seemed in pain and took her back to vet and she need up passing.  It ended up that the dog was bite by a brown recluse.  Reports he work 4, 10 hour days a week.  He reports he  has just wanted to sleep.  He has a house but has been staying with his Mom after the surgery.  Yesterday was his first day back at work.  Reports he does not have a large social group.  He has some friends from work and occasionally hang out with ihs brother.  His interests include playing video games.  He reports he has a large appetite.  Reports he had a buscuit this morning, he always has oatmeal for at least one meal . He eats lots of fish in his diet.  He will eat grilled salmon often.  He does not eat any sides with this but he will  eat a kale spinach smoothie during the day as another snack/meal.   Past Medical History:  Diagnosis Date  . ADD (attention deficit disorder)      No Known Allergies  Current Outpatient Medications on File Prior to Visit  Medication Sig  . acetaminophen (TYLENOL) 500 MG tablet Tylenol 1000 mg every 8 hours as needed for pain.  You can alternate this with ibuprofen.  Do not exceed 4000 mg of Tylenol per day.  You can buy this over-the-counter at any drugstore.  Marland Kitchen amphetamine-dextroamphetamine (ADDERALL) 20 MG tablet Take 20 mg by mouth daily.  . busPIRone (BUSPAR) 10 MG tablet Take one tablet by mouth three times AS NEEDED for anxiety.  . Cholecalciferol 1.25 MG (50000 UT) capsule Take one tablet by mouth three days a week for twelve weeks.  Marland Kitchen escitalopram (LEXAPRO) 10 MG tablet Take 1 tablet (10 mg total) by mouth daily.  . Polyethylene Glycol 3350 (MIRALAX PO) Take by mouth. Take as needed.  . psyllium (METAMUCIL) 58.6 % powder Take 1 packet by mouth daily.   No current facility-administered medications on file prior to visit.     ROS: all negative except above.   Physical Exam:  There were no vitals taken for this visit.  General Appearance: Pale, in no apparent distress. Eyes: PERRLA, EOMs, conjunctiva no swelling or erythema Sinuses: No Frontal/maxillary tenderness ENT/Mouth: Ext aud canals clear, TMs without erythema, bulging. No erythema, swelling, or exudate on post pharynx.  Tonsils not swollen or erythematous. Hearing normal.  Neck: Supple, thyroid normal.  Respiratory: Respiratory effort normal, BS equal bilaterally without rales, rhonchi, wheezing or stridor.  Cardio: RRR with no MRGs. Brisk peripheral pulses without edema.  Abdomen: Soft, + BS.  Non tender, no guarding, rebound, hernias, masses. Lymphatics: Non tender without lymphadenopathy.  Musculoskeletal: Full ROM, 5/5 strength, normal gait.  Skin: Warm, dry without rashes, lesions, ecchymosis.  Neuro: Cranial  nerves intact. Normal muscle tone, no cerebellar symptoms. Sensation intact.  Psych: Awake and oriented X 3, normal affect, Insight and Judgment appropriate.     Elder Negus, NP 11:28 AM [facility]

## 2019-09-15 ENCOUNTER — Ambulatory Visit: Payer: Commercial Managed Care - PPO | Admitting: Adult Health Nurse Practitioner

## 2019-09-21 NOTE — Progress Notes (Deleted)
NO SHOW

## 2019-09-22 ENCOUNTER — Ambulatory Visit: Payer: Commercial Managed Care - PPO | Admitting: Adult Health Nurse Practitioner

## 2019-09-22 DIAGNOSIS — I10 Essential (primary) hypertension: Secondary | ICD-10-CM

## 2021-02-18 ENCOUNTER — Encounter: Payer: Self-pay | Admitting: Adult Health

## 2021-02-18 DIAGNOSIS — E559 Vitamin D deficiency, unspecified: Secondary | ICD-10-CM | POA: Insufficient documentation

## 2021-02-18 NOTE — Progress Notes (Signed)
Assessment and Plan:  Austin [name] was seen today for follow-up.  Diagnoses and all orders for this visit:  Anxiety Start new medication as prescribed - start 1/2 tab daily then increase to whole tab in 2 weeks Max benefit at weeks 8-12 Follow up with progress Stress management techniques discussed, increase water, good sleep hygiene discussed, increase exercise, and increase veggies.  Call the office if any new AE's from medications and we will switch them -     escitalopram (LEXAPRO) 10 MG tablet; Take 1 tablet (10 mg total) by mouth daily.  Attention deficit disorder, unspecified hyperactivity presence PDMP reviewed; previously prescribed 30 mg XR via psych with significant anxiety worse in the evening but struggling with focus at work Will try 10 mg IR just in AM on days he works The patient was counseled on the addictive nature of the medication and was encouraged to take drug holidays when not needed.  -     amphetamine-dextroamphetamine (ADDERALL) 10 MG tablet; Take 1 tab in the morning as needed for focus; try to limit to <5 days/week.  BMI less than 19,adult Has had normal labs; chronic/baseline weight  Father with similar body habitus Discussed high protein/calorie diet, needs caloric surplus Strategies discussed Monitor at each appointment  Vitamin D deficiency Continue supplement; defer check due to cash pay, will have insurance next visit  Further disposition pending results of labs. Discussed med's effects and SE's.   Over 30 minutes of exam, counseling, chart review, and critical decision making was performed.   Future Appointments  Date Time Provider Department Center  08/29/2021  9:00 AM [name], NP [facility] None    ------------------------------------------------------------------------------------------------------------------   HPI  29 y.o.male presents for follow up on anxiety, ADD, underweight, vit D def.   Currently uninsured.   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 However later in interview admits chronic anxiety, some panic attacks in the evening, fiance is Autistic and very young child, tends to have more anxiety in the evening, some panic attacks.   Denies SI/HI, drug use, alcohol use.   He has long hx of ADD, has been prescribed adderall 30 mg XR by psych office but felt this was contributing to anxiety, fiance told him he "acts odd" while on it, also lost insurance and couldn't afford, but would like to try a lower dose, IR.   06/07/19- He presented to ED severe abdominal pain with volvulus which results in bowel resection. He had prior double hernia repair.  Then approximately  He has had follow up with Dr Fleeta Emmer for this. He reports doing much better, no concerns.   BMI is Body mass index is 15.9 kg/m., he has been working on diet and exercise, trying to eat less processed, eats 3 full meals most days, has always been similar weight, wants to start working on to gain muscle. Strategies discussed - needs caloric surplus. Has had normal TSH.  Wt Readings from Last 3 Encounters:  02/19/21 114 lb (51.7 kg)  08/11/19 120 lb 6.4 oz (54.6 kg)  06/08/19 110 lb (49.9 kg)   Patient is on Vitamin D supplement, completed 2 months of 10932 IU ?, now taking vitamin D in multivitamin. Has noted benefit with energy levels.  Lab Results  Component Value Date   VD25OH 26 (L) 08/11/2019     Lab Results  Component Value  Date   TSH 0.78 08/11/2019     Past Medical History:  Diagnosis Date  . ADD (attention deficit disorder)   . Right inguinal hernia 12/09/2017  . Volvulus ([facility]) 06/07/2019     No Known Allergies  Current Outpatient Medications on File Prior to Visit  Medication Sig  . acetaminophen (TYLENOL) 500 MG tablet Tylenol 1000 mg every 8 hours as needed for pain.  You can  alternate this with ibuprofen.  Do not exceed 4000 mg of Tylenol per day.  You can buy this over-the-counter at any drugstore.  . Cholecalciferol 1.25 MG (50000 UT) capsule Take one tablet by mouth three days a week for twelve weeks.   No current facility-administered medications on file prior to visit.    ROS: all negative except above.   Physical Exam:  BP 116/72   Pulse 94   Temp (!) 97.5 F (36.4 C)   Wt 114 lb (51.7 kg)   SpO2 99%   BMI 15.90 kg/m   General Appearance: Very thin adult male, good hygiene, appears younger than stated age, in no apparent distress. Eyes: PERRLA, EOMs, conjunctiva no swelling or erythema Sinuses: No Frontal/maxillary tenderness ENT/Mouth: Ext aud canals clear, TMs without erythema, bulging. No erythema, swelling, or exudate on post pharynx.  Tonsils not swollen or erythematous. Hearing normal.  Neck: Supple, thyroid normal.  Respiratory: Respiratory effort normal, BS equal bilaterally without rales, rhonchi, wheezing or stridor.  Cardio: RRR with no MRGs. Brisk peripheral pulses without edema.  Abdomen: Soft, + BS.  Non tender, no guarding, rebound, hernias, masses. Lymphatics: Non tender without lymphadenopathy.  Musculoskeletal: Full ROM, 5/5 strength, normal gait.  Skin: Warm, dry without rashes, lesions, ecchymosis.  Neuro: Cranial nerves intact. Normal muscle tone, no cerebellar symptoms. Sensation intact.  Psych: Awake and oriented X 3, Mildly anxious affect, Insight and Judgment appropriate.     [name], NP 11:28 AM [facility]

## 2021-02-19 ENCOUNTER — Encounter: Payer: Self-pay | Admitting: Adult Health

## 2021-02-19 ENCOUNTER — Other Ambulatory Visit: Payer: Self-pay

## 2021-02-19 ENCOUNTER — Ambulatory Visit (INDEPENDENT_AMBULATORY_CARE_PROVIDER_SITE_OTHER): Payer: Commercial Managed Care - PPO | Admitting: Adult Health

## 2021-02-19 VITALS — BP 116/72 | HR 94 | Temp 97.5°F | Wt 114.0 lb

## 2021-02-19 DIAGNOSIS — Z681 Body mass index (BMI) 19 or less, adult: Secondary | ICD-10-CM

## 2021-02-19 DIAGNOSIS — E559 Vitamin D deficiency, unspecified: Secondary | ICD-10-CM

## 2021-02-19 DIAGNOSIS — E43 Unspecified severe protein-calorie malnutrition: Secondary | ICD-10-CM

## 2021-02-19 DIAGNOSIS — F419 Anxiety disorder, unspecified: Secondary | ICD-10-CM

## 2021-02-19 DIAGNOSIS — F988 Other specified behavioral and emotional disorders with onset usually occurring in childhood and adolescence: Secondary | ICD-10-CM

## 2021-02-19 MED ORDER — AMPHETAMINE-DEXTROAMPHETAMINE 10 MG PO TABS: ORAL_TABLET | ORAL | 0 refills | Status: DC

## 2021-02-19 MED ORDER — ESCITALOPRAM OXALATE 10 MG PO TABS: 10.0000 mg | ORAL_TABLET | Freq: Every day | ORAL | 0 refills | Status: DC

## 2021-02-19 NOTE — Patient Instructions (Signed)
Reduce xanax to 1/2 tab daily for 3-4 days, then every other day for a week then stop   New guidelines suggest the benzodiazepines are best short term, with prolonged use they lead to physical and psychological dependence. In addition, evidence suggest that for insomnia the effectiveness wanes in 4 weeks and the risks out weight their benefits. Use of these agents have been associated with dementia, falls, motor vehicle accidents and physical addiction. Decreasing these medication have been proven to show improvements in cognition, alertness, decrease of falls and daytime sedation.   Symptoms of withdrawal include, insomnia, anxiety, irritability, sweating and stomach or intestinal symptoms like diarrhea or nausea.        Escitalopram Tablets What is this medicine? ESCITALOPRAM (es sye TAL oh pram) is used to treat depression and certain types of anxiety. This medicine may be used for other purposes; ask your health care provider or pharmacist if you have questions. COMMON BRAND NAME(S): Lexapro What should I tell my health care provider before I take this medicine? They need to know if you have any of these conditions:  bipolar disorder or a family history of bipolar disorder  diabetes  glaucoma  heart disease  kidney or liver disease  receiving electroconvulsive therapy  seizures (convulsions)  suicidal thoughts, plans, or attempt by you or a family member  an unusual or allergic reaction to escitalopram, the related drug citalopram, other medicines, foods, dyes, or preservatives  pregnant or trying to become pregnant  breast-feeding How should I use this medicine? Take this medicine by mouth with a glass of water. Follow the directions on the prescription label. You can take it with or without food. If it upsets your stomach, take it with food. Take your medicine at regular intervals. Do not take it more often than directed. Do not stop taking this medicine suddenly  except upon the advice of your doctor. Stopping this medicine too quickly may cause serious side effects or your condition may worsen. A special MedGuide will be given to you by the pharmacist with each prescription and refill. Be sure to read this information carefully each time. Talk to your pediatrician regarding the use of this medicine in children. Special care may be needed. Overdosage: If you think you have taken too much of this medicine contact a poison control center or emergency room at once. NOTE: This medicine is only for you. Do not share this medicine with others. What if I miss a dose? If you miss a dose, take it as soon as you can. If it is almost time for your next dose, take only that dose. Do not take double or extra doses. What may interact with this medicine? Do not take this medicine with any of the following medications:  certain medicines for fungal infections like fluconazole, itraconazole, ketoconazole, posaconazole, voriconazole  cisapride  citalopram  dronedarone  linezolid  MAOIs like Carbex, Eldepryl, Marplan, Nardil, and Parnate  methylene blue (injected into a vein)  pimozide  thioridazine This medicine may also interact with the following medications:  alcohol  amphetamines  aspirin and aspirin-like medicines  carbamazepine  certain medicines for depression, anxiety, or psychotic disturbances  certain medicines for migraine headache like almotriptan, eletriptan, frovatriptan, naratriptan, rizatriptan, sumatriptan, zolmitriptan  certain medicines for sleep  certain medicines that treat or prevent blood clots like warfarin, enoxaparin, dalteparin  cimetidine  diuretics  dofetilide  fentanyl  furazolidone  isoniazid  lithium  metoprolol  NSAIDs, medicines for pain and inflammation, like ibuprofen or  naproxen  other medicines that prolong the QT interval (cause an abnormal heart  rhythm)  procarbazine  rasagiline  supplements like St. John's wort, kava kava, valerian  tramadol  tryptophan  ziprasidone This list may not describe all possible interactions. Give your health care provider a list of all the medicines, herbs, non-prescription drugs, or dietary supplements you use. Also tell them if you smoke, drink alcohol, or use illegal drugs. Some items may interact with your medicine. What should I watch for while using this medicine? Tell your doctor if your symptoms do not get better or if they get worse. Visit your doctor or health care professional for regular checks on your progress. Because it may take several weeks to see the full effects of this medicine, it is important to continue your treatment as prescribed by your doctor. Patients and their families should watch out for new or worsening thoughts of suicide or depression. Also watch out for sudden changes in feelings such as feeling anxious, agitated, panicky, irritable, hostile, aggressive, impulsive, severely restless, overly excited and hyperactive, or not being able to sleep. If this happens, especially at the beginning of treatment or after a change in dose, call your health care professional. Bonita Quin may get drowsy or dizzy. Do not drive, use machinery, or do anything that needs mental alertness until you know how this medicine affects you. Do not stand or sit up quickly, especially if you are an older patient. This reduces the risk of dizzy or fainting spells. Alcohol may interfere with the effect of this medicine. Avoid alcoholic drinks. Your mouth may get dry. Chewing sugarless gum or sucking hard candy, and drinking plenty of water may help. Contact your doctor if the problem does not go away or is severe. What side effects may I notice from receiving this medicine? Side effects that you should report to your doctor or health care professional as soon as possible:  allergic reactions like skin rash,  itching or hives, swelling of the face, lips, or tongue  anxious  black, tarry stools  changes in vision  confusion  elevated mood, decreased need for sleep, racing thoughts, impulsive behavior  eye pain  fast, irregular heartbeat  feeling faint or lightheaded, falls  feeling agitated, angry, or irritable  hallucination, loss of contact with reality  loss of balance or coordination  loss of memory  painful or prolonged erections  restlessness, pacing, inability to keep still  seizures  stiff muscles  suicidal thoughts or other mood changes  trouble sleeping  unusual bleeding or bruising  unusually weak or tired  vomiting Side effects that usually do not require medical attention (report to your doctor or health care professional if they continue or are bothersome):  changes in appetite  change in sex drive or performance  headache  increased sweating  indigestion, nausea  tremors This list may not describe all possible side effects. Call your doctor for medical advice about side effects. You may report side effects to FDA at 1-800-FDA-1088. Where should I keep my medicine? Keep out of reach of children. Store at room temperature between 15 and 30 degrees C (59 and 86 degrees F). Throw away any unused medicine after the expiration date. NOTE: This sheet is a summary. It may not cover all possible information. If you have questions about this medicine, talk to your doctor, pharmacist, or health care provider.  2021 Elsevier/Gold Standard (2020-09-24 09:53:34)

## 2021-03-15 ENCOUNTER — Other Ambulatory Visit: Payer: Self-pay | Admitting: Adult Health

## 2021-03-15 ENCOUNTER — Telehealth: Payer: Self-pay

## 2021-03-15 DIAGNOSIS — F988 Other specified behavioral and emotional disorders with onset usually occurring in childhood and adolescence: Secondary | ICD-10-CM

## 2021-03-15 MED ORDER — AMPHETAMINE-DEXTROAMPHETAMINE 10 MG PO TABS: ORAL_TABLET | ORAL | 0 refills | Status: DC

## 2021-03-15 NOTE — Telephone Encounter (Signed)
Refill request for Adderall

## 2021-03-15 NOTE — Progress Notes (Signed)
Future Appointments  Date Time Provider Department Center  08/29/2021  9:00 AM [name], NP [facility] None    PDMP reviewed for adderall refill request.

## 2021-03-20 IMAGING — DX DG ABDOMEN ACUTE W/ 1V CHEST
3 series · 3 of 3 positions shown · non-contrast
Comparison: None.

CLINICAL DATA: Abdominal pain

EXAM:
DG ABDOMEN ACUTE W/ 1V CHEST

[chest pa]
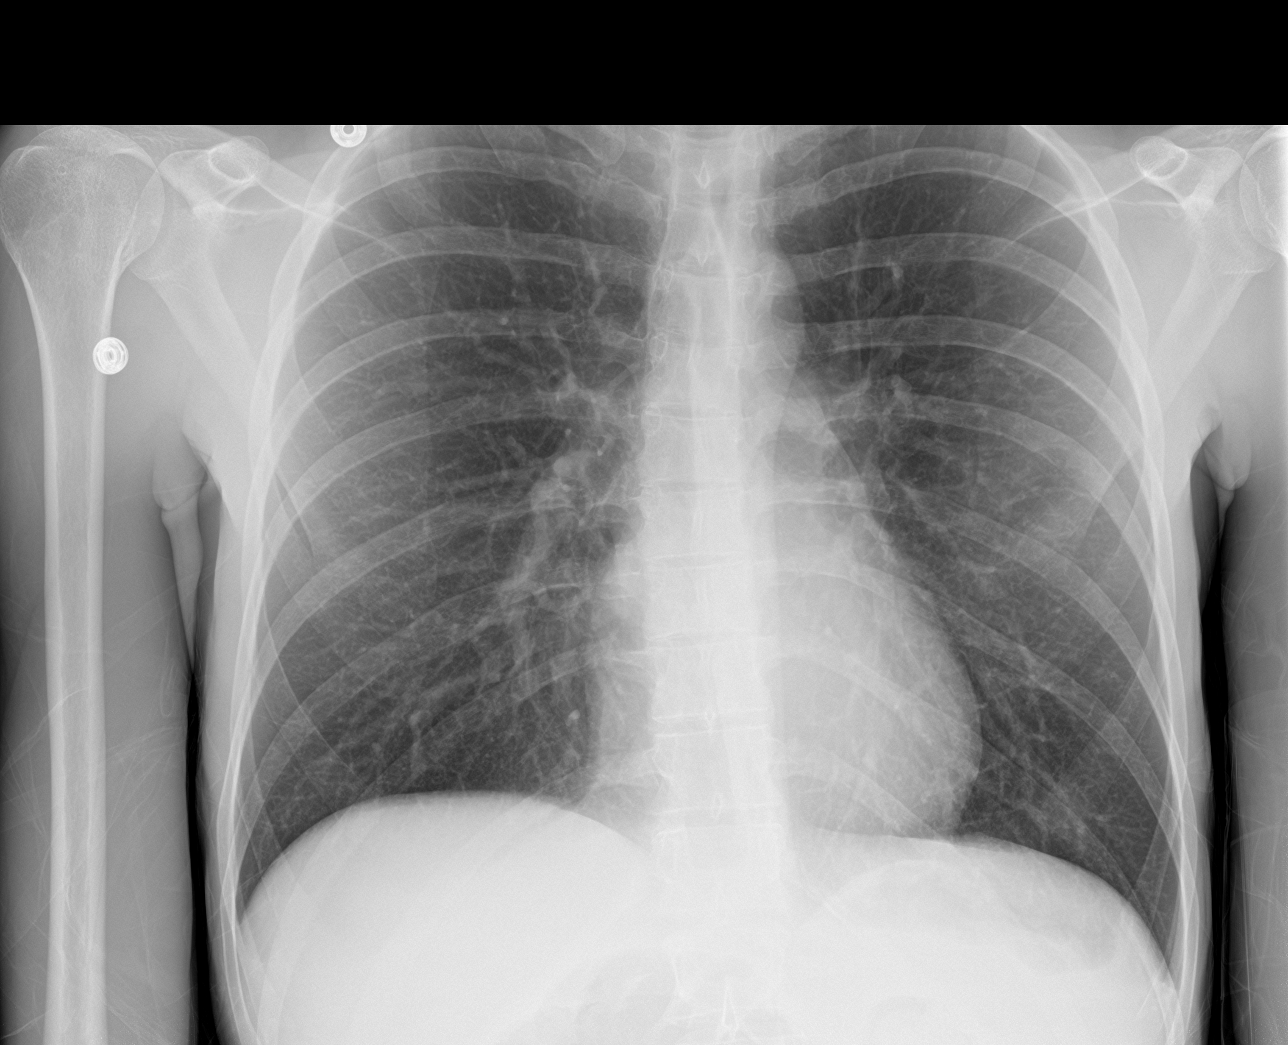

[abdomen erect]
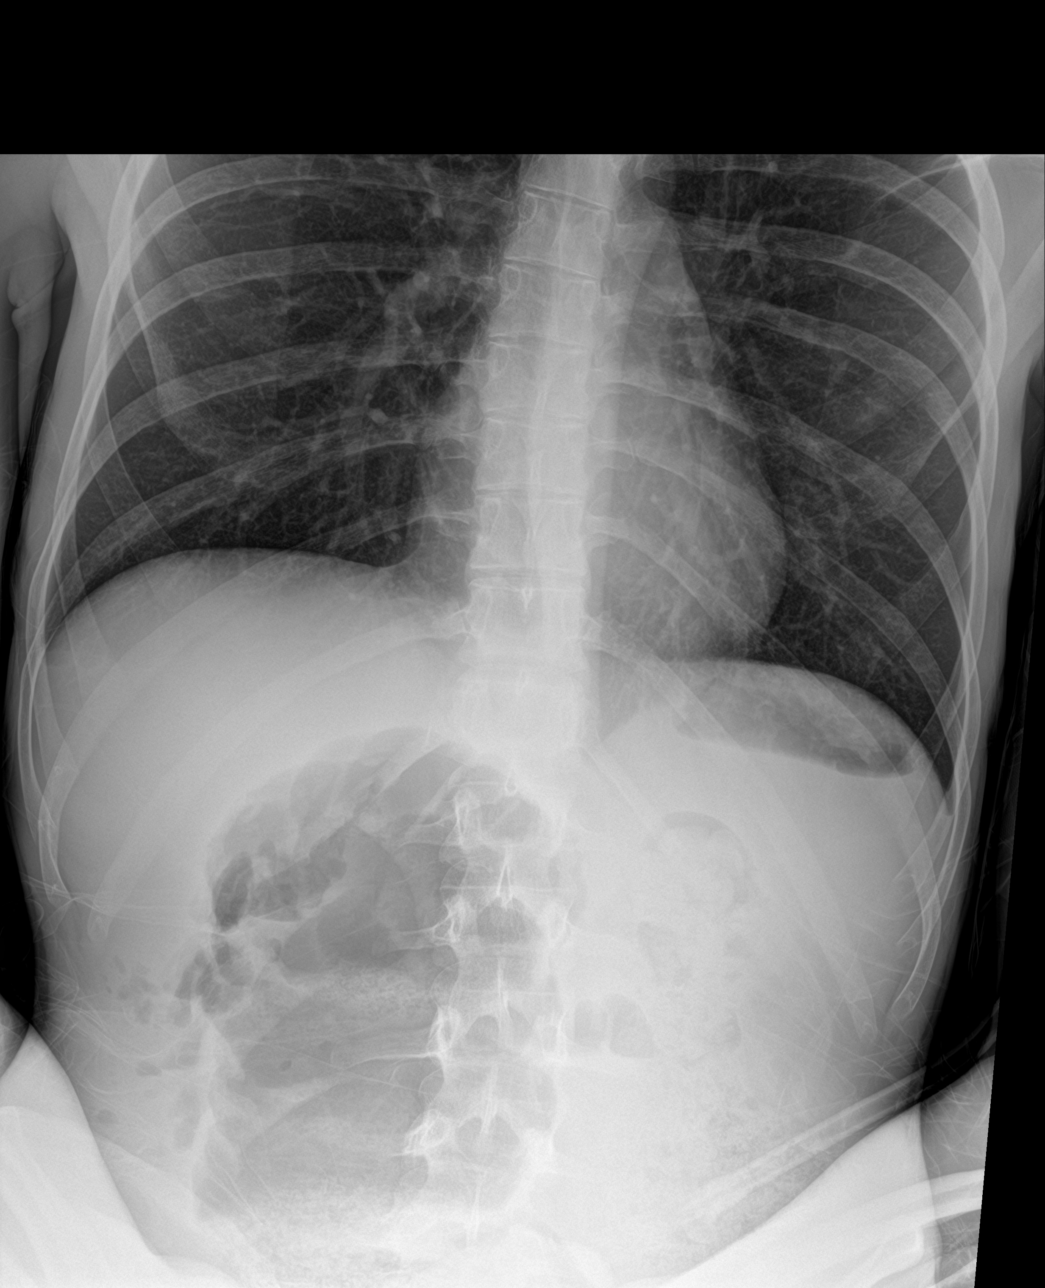

[abdomen supine]
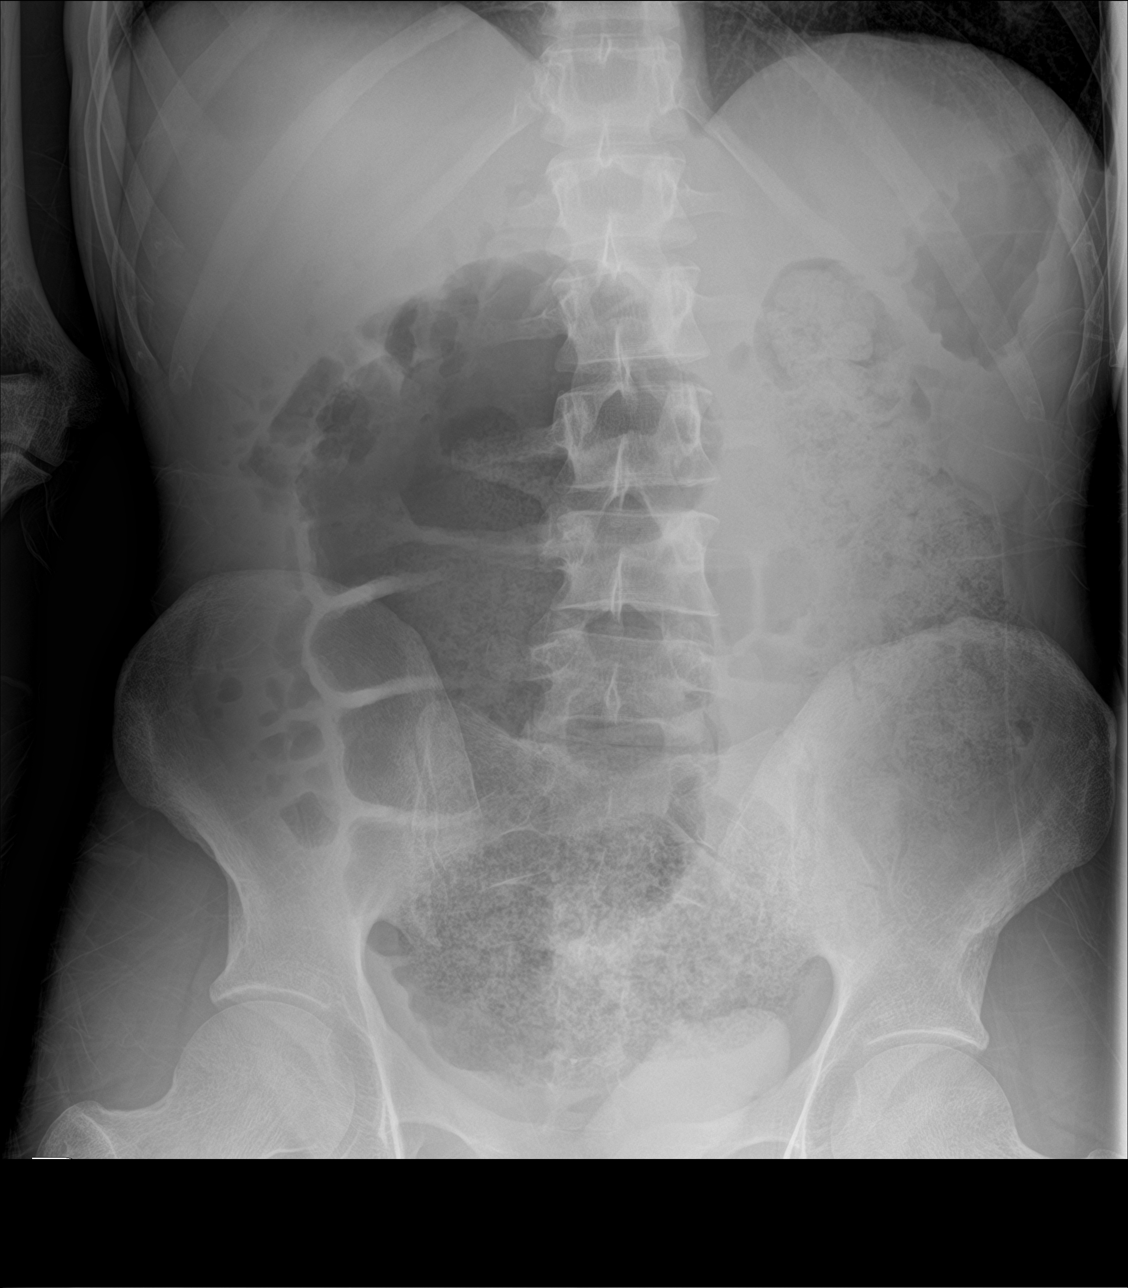

[3 of 3 positions shown; findings below may reference images not displayed]

FINDINGS: Colonic distention by stool and gas. No small bowel distention or
evident pneumoperitoneum. No concerning mass effect or
calcification.

There is no edema, consolidation, effusion, or pneumothorax. Normal
heart size and mediastinal contours.
IMPRESSION: Distended colon by stool distally and gas proximally.

## 2021-03-27 IMAGING — DX ABDOMEN - 1 VIEW
2 series · 2 of 2 positions shown · non-contrast
Comparison: Radiographs and CT 06/07/2019.

CLINICAL DATA: Nausea and vomiting since yesterday. Right
hemicolectomy 06/07/2019 for volvulus.

EXAM:
ABDOMEN - 1 VIEW

[abdomen kub (1 of 2)]
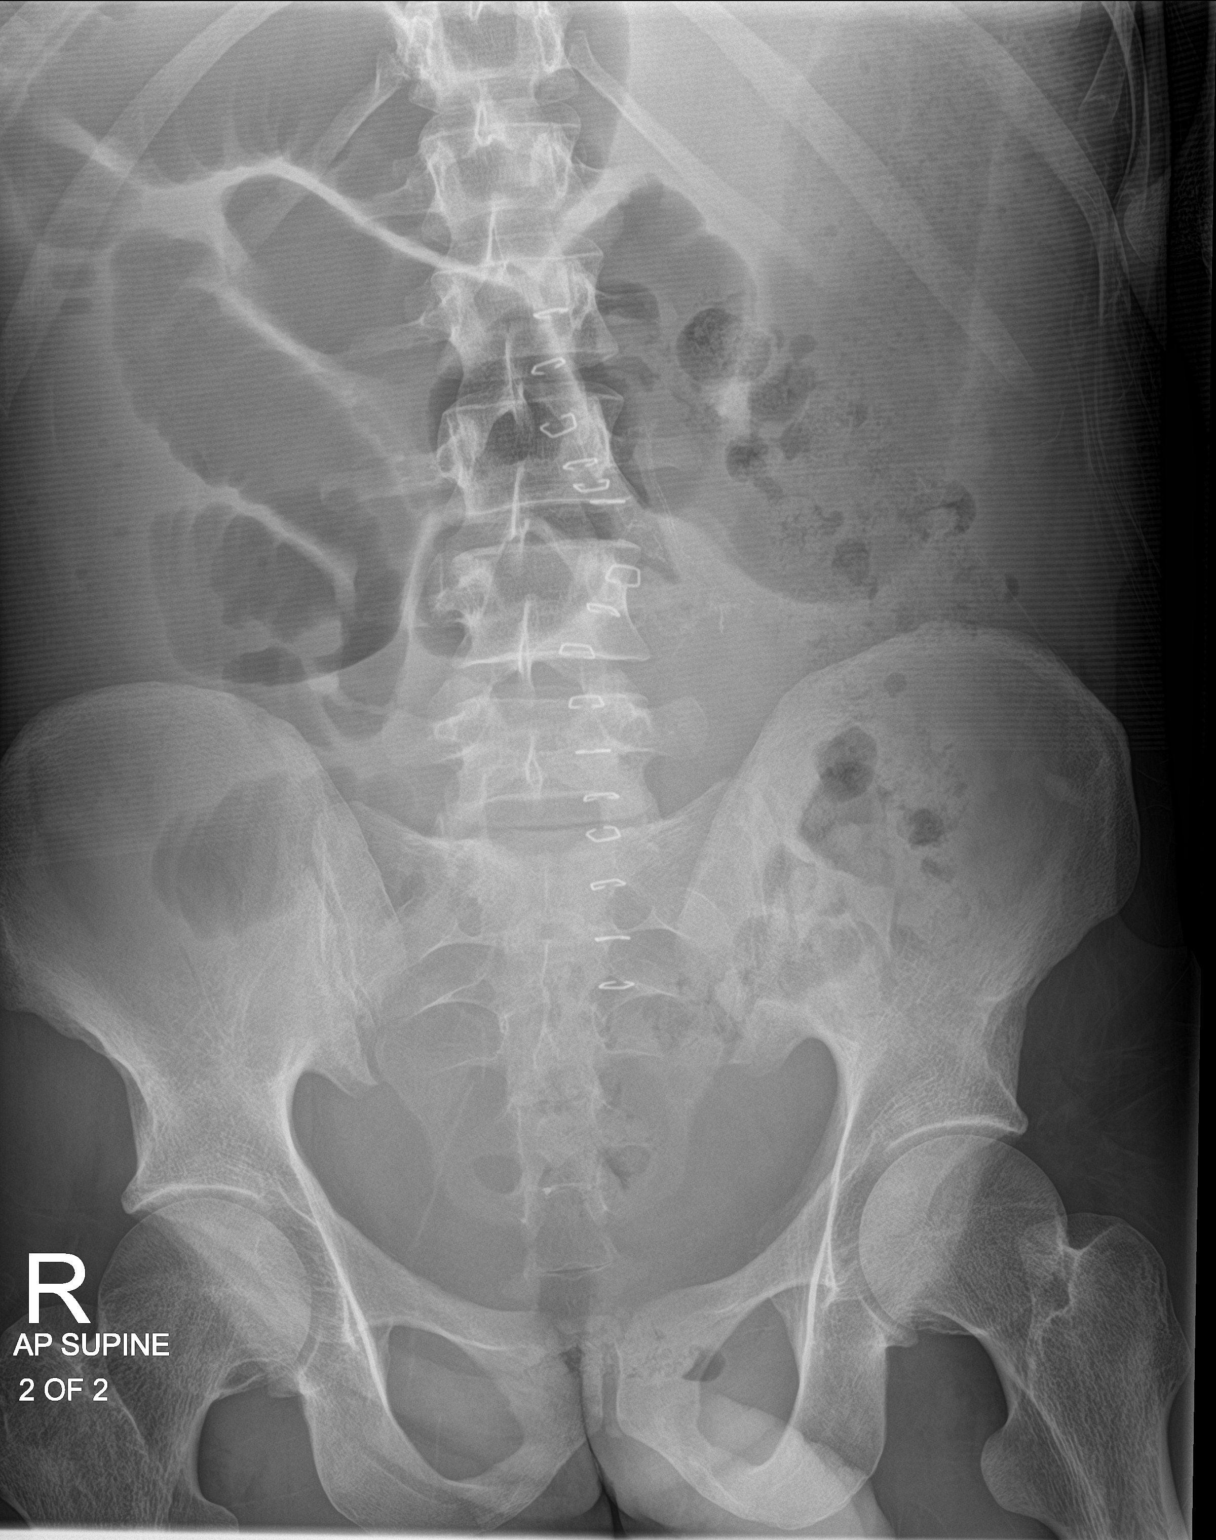

[abdomen kub (2 of 2)]
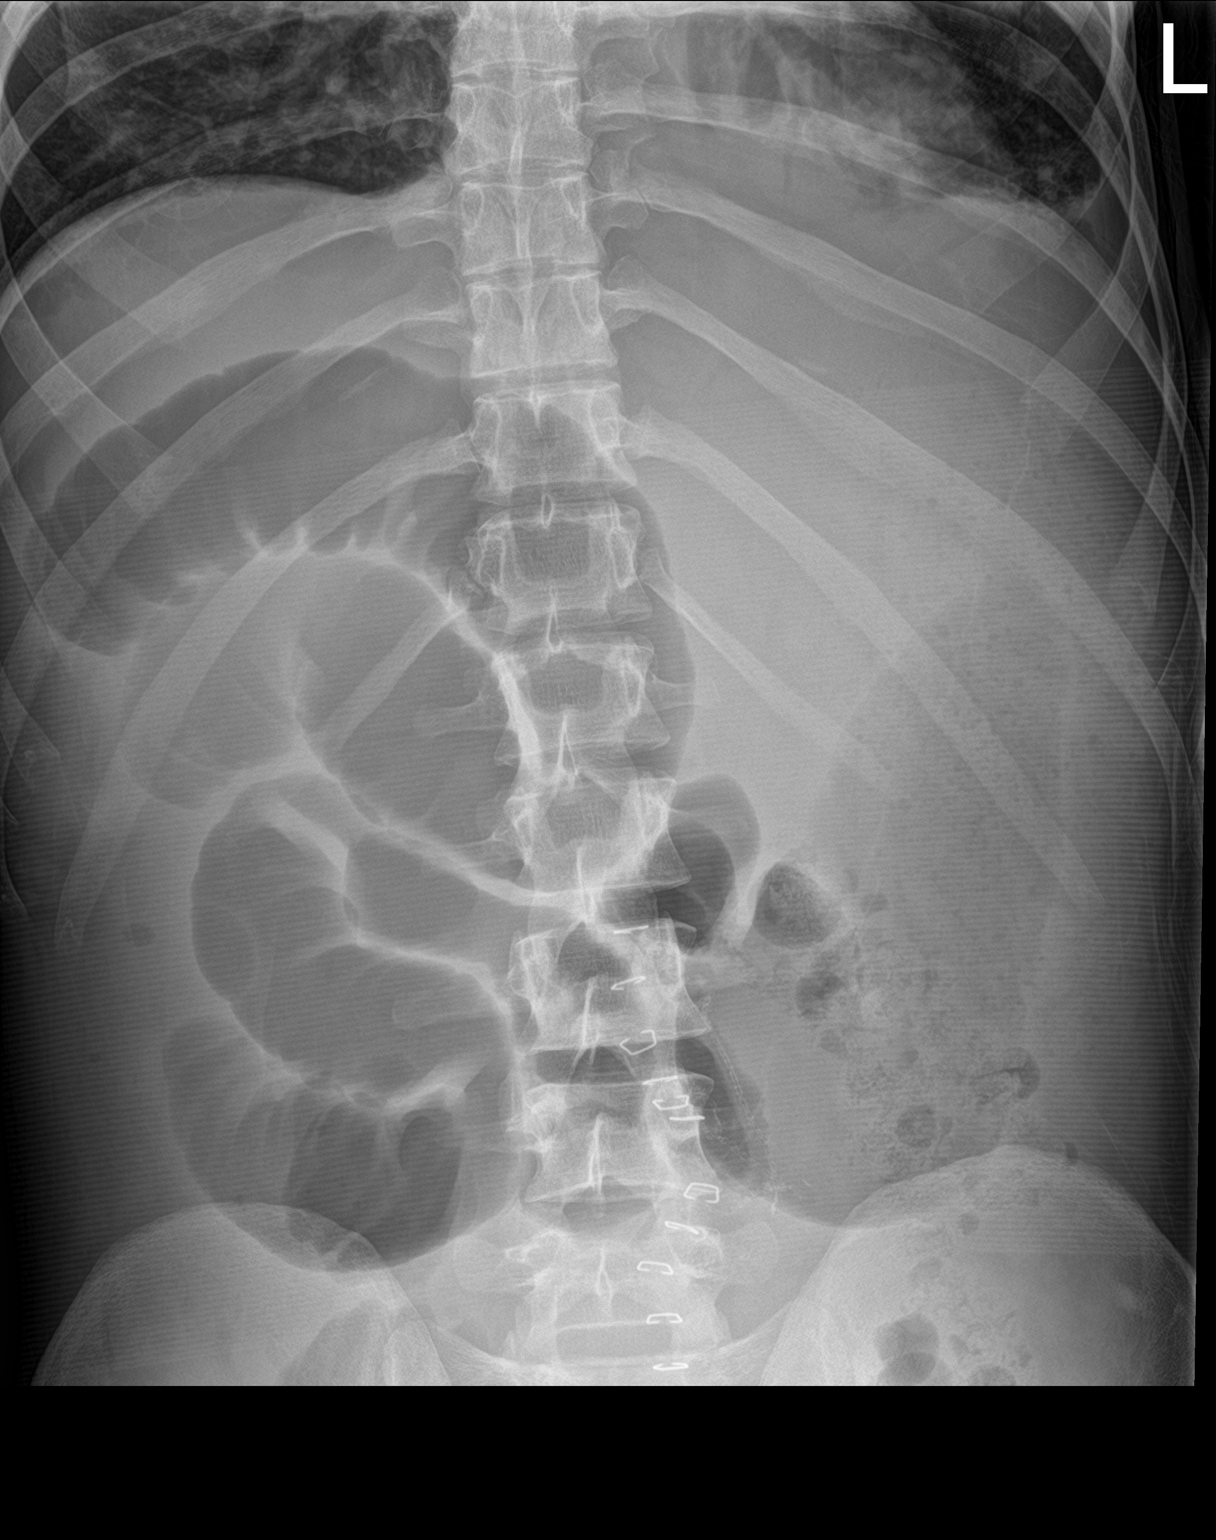

[2 of 2 positions shown; findings below may reference images not displayed]

FINDINGS: There are midline skin staples attributed to recent abdominal
surgery. No significant residual dilated colon is identified. There
are several loops of mildly dilated small bowel in the right
abdomen. No supine evidence of free intraperitoneal air. No
suspicious abdominal calcifications. The bones appear unremarkable.
IMPRESSION: Mildly dilated small bowel status post recent surgery for presumed
cecal volvulus. In the early postoperative setting, this is most
likely secondary to focal ileus. Consider CT or follow-up
radiographs if clinical concern of mechanical bowel obstruction.

## 2021-04-09 ENCOUNTER — Telehealth: Payer: Self-pay

## 2021-04-09 NOTE — Telephone Encounter (Signed)
Requesting his Adderall dosage be increased, not working well during work hours anymore. Please advise

## 2021-04-10 NOTE — Telephone Encounter (Signed)
Scheduling an appointment to be seen

## 2021-04-18 ENCOUNTER — Telehealth: Payer: Self-pay

## 2021-04-18 ENCOUNTER — Other Ambulatory Visit: Payer: Self-pay | Admitting: Adult Health

## 2021-04-18 DIAGNOSIS — F988 Other specified behavioral and emotional disorders with onset usually occurring in childhood and adolescence: Secondary | ICD-10-CM

## 2021-04-18 MED ORDER — AMPHETAMINE-DEXTROAMPHETAMINE 10 MG PO TABS: ORAL_TABLET | ORAL | 0 refills | Status: DC

## 2021-04-18 NOTE — Telephone Encounter (Signed)
Adderall refill request 

## 2021-04-18 NOTE — Progress Notes (Signed)
Future Appointments  Date Time Provider Department Center  04/24/2021 10:30 AM [name], NP [facility] None  08/29/2021  9:00 AM [name], NP [facility] None   PDMP reviewed for adderall refill request.

## 2021-04-22 NOTE — Progress Notes (Deleted)
Assessment and Plan:  Austin [name] was seen today for follow-up.  Diagnoses and all orders for this visit:  Anxiety Start new medication as prescribed - start 1/2 tab daily then increase to whole tab in 2 weeks Max benefit at weeks 8-12 Follow up with progress Stress management techniques discussed, increase water, good sleep hygiene discussed, increase exercise, and increase veggies.  Call the office if any new AE's from medications and we will switch them -     escitalopram (LEXAPRO) 10 MG tablet; Take 1 tablet (10 mg total) by mouth daily.  Attention deficit disorder, unspecified hyperactivity presence PDMP reviewed; previously prescribed 30 mg XR via psych with significant anxiety worse in the evening but struggling with focus at work Will try 10 mg IR just in AM on days he works The patient was counseled on the addictive nature of the medication and was encouraged to take drug holidays when not needed.  -     amphetamine-dextroamphetamine (ADDERALL) 10 MG tablet; Take 1 tab in the morning as needed for focus; try to limit to <5 days/week.  BMI less than 19,adult Has had normal labs; chronic/baseline weight  Father with similar body habitus Discussed high protein/calorie diet, needs caloric surplus Strategies discussed Monitor at each appointment  Vitamin D deficiency Continue supplement; defer check due to cash pay, will have insurance next visit  Further disposition pending results of labs. Discussed med's effects and SE's.   Over 30 minutes of exam, counseling, chart review, and critical decision making was performed.   Future Appointments  Date Time Provider Department Center  04/24/2021 10:30 AM [name], NP [facility] None  08/29/2021  9:00 AM [name], NP [facility] None    ------------------------------------------------------------------------------------------------------------------   HPI  30 y.o.male presents for follow up on anxiety, ADD, underweight,  vit D def.   Currently uninsured. ***  He reports chronic anxiety, some panic attacks in the evening, Austin [name] is Autistic and very young child, tends to have more anxiety in the evening, some panic attacks. After discussion he opted to start on lexapro *** Denies SI/HI, drug use, alcohol use.   He has long hx of ADD, has been prescribed adderall 30 mg XR by psych office but felt this was contributing to anxiety, Austin [name] told him he "acts odd" while on it, also lost insurance and couldn't afford, but would like to try a lower dose, IR. He was prescribed adderall 10 mg ***  BMI is There is no height or weight on file to calculate BMI., he has been working on diet and exercise, trying to eat less processed, eats 3 full meals most days, has always been similar weight, wants to start working on to gain muscle. Strategies discussed - needs caloric surplus. Has had normal TSH.  Wt Readings from Last 3 Encounters:  02/19/21 114 lb (51.7 kg)  08/11/19 120 lb 6.4 oz (54.6 kg)  06/08/19 110 lb (49.9 kg)   Patient is on Vitamin D supplement, completed 2 months of 82707 IU ?, now taking vitamin D in multivitamin. Has noted benefit with energy levels.  Lab Results  Component Value Date   VD25OH 26 (L) 08/11/2019     Lab Results  Component Value Date   TSH 0.78 08/11/2019     Past Medical History:  Diagnosis Date  . ADD (attention deficit disorder)   . Right inguinal hernia 12/09/2017  . Volvulus (HCC) 06/07/2019     No Known Allergies  Current Outpatient Medications on File Prior to Visit  Medication  Sig  . acetaminophen (TYLENOL) 500 MG tablet Tylenol 1000 mg every 8 hours as needed for pain.  You can alternate this with ibuprofen.  Do not exceed 4000 mg of Tylenol per day.  You can buy this over-the-counter at any drugstore.  Marland Kitchen amphetamine-dextroamphetamine (ADDERALL) 10 MG tablet Take 1 tab in the morning as needed for focus; try to limit to <5 days/week.  . Cholecalciferol 1.25 MG (50000 UT)  capsule Take one tablet by mouth three days a week for twelve weeks.  Marland Kitchen escitalopram (LEXAPRO) 10 MG tablet Take 1 tablet (10 mg total) by mouth daily.   No current facility-administered medications on file prior to visit.    ROS: all negative except above.   Physical Exam:  There were no vitals taken for this visit.  General Appearance: Very thin adult male, good hygiene, appears younger than stated age, in no apparent distress. Eyes: PERRLA, EOMs, conjunctiva no swelling or erythema Sinuses: No Frontal/maxillary tenderness ENT/Mouth: Ext aud canals clear, TMs without erythema, bulging. No erythema, swelling, or exudate on post pharynx.  Tonsils not swollen or erythematous. Hearing normal.  Neck: Supple, thyroid normal.  Respiratory: Respiratory effort normal, BS equal bilaterally without rales, rhonchi, wheezing or stridor.  Cardio: RRR with no MRGs. Brisk peripheral pulses without edema.  Abdomen: Soft, + BS.  Non tender, no guarding, rebound, hernias, masses. Lymphatics: Non tender without lymphadenopathy.  Musculoskeletal: Full ROM, 5/5 strength, normal gait.  Skin: Warm, dry without rashes, lesions, ecchymosis.  Neuro: Cranial nerves intact. Normal muscle tone, no cerebellar symptoms. Sensation intact.  Psych: Awake and oriented X 3, Mildly anxious affect, Insight and Judgment appropriate.     [name], NP 11:28 AM [facility]

## 2021-04-24 ENCOUNTER — Ambulatory Visit: Payer: Self-pay | Admitting: Adult Health

## 2021-04-24 DIAGNOSIS — Z681 Body mass index (BMI) 19 or less, adult: Secondary | ICD-10-CM

## 2021-04-24 DIAGNOSIS — F419 Anxiety disorder, unspecified: Secondary | ICD-10-CM

## 2021-04-24 DIAGNOSIS — F988 Other specified behavioral and emotional disorders with onset usually occurring in childhood and adolescence: Secondary | ICD-10-CM

## 2021-05-16 ENCOUNTER — Other Ambulatory Visit: Payer: Self-pay

## 2021-05-16 ENCOUNTER — Ambulatory Visit (INDEPENDENT_AMBULATORY_CARE_PROVIDER_SITE_OTHER): Payer: Self-pay | Admitting: Adult Health

## 2021-05-16 ENCOUNTER — Encounter: Payer: Self-pay | Admitting: Adult Health

## 2021-05-16 DIAGNOSIS — F988 Other specified behavioral and emotional disorders with onset usually occurring in childhood and adolescence: Secondary | ICD-10-CM

## 2021-05-16 DIAGNOSIS — F419 Anxiety disorder, unspecified: Secondary | ICD-10-CM

## 2021-05-16 MED ORDER — ESCITALOPRAM OXALATE 10 MG PO TABS: 10.0000 mg | ORAL_TABLET | Freq: Every day | ORAL | 1 refills | Status: DC

## 2021-05-16 MED ORDER — AMPHETAMINE-DEXTROAMPHETAMINE 20 MG PO TABS: ORAL_TABLET | ORAL | 0 refills | Status: DC

## 2021-05-16 NOTE — Progress Notes (Signed)
Assessment and Plan:  Austin [name] was seen today for follow-up.  Diagnoses and all orders for this visit:  Anxiety Well managed by current regimen; continue medications Stress management techniques discussed, increase water, good sleep hygiene discussed, increase exercise, and increase veggies.  -     escitalopram (LEXAPRO) 10 MG tablet; Take 1 tablet (10 mg total) by mouth daily.  Attention deficit disorder, unspecified hyperactivity presence PDMP reviewed; previously prescribed 30 mg XR via psych with significant anxiety worse in the evening but struggling with focus at work Switched to IR, 10 mg appears to low, after discussion will increase to 20 mg tab, 1/2-1 tab BID; suggest 20 mg AM with PRN 10 mg in afternoons The patient was counseled on the addictive nature of the medication and was encouraged to take drug holidays when not needed.  Follow up in 3 months or sooner if concerns with new med -     amphetamine-dextroamphetamine (ADDERALL) 20 MG tablet; Take 1/2-1 tab twice daily as needed for focus; try to limit to <5 days/week.  BMI less than 19,adult Has had normal labs; chronic/baseline weight  Father with similar body habitus Discussed high protein/calorie diet, needs caloric surplus Strategies discussed Monitor at each appointment  Vitamin D deficiency Continue supplement; defer check due to cash pay, will have insurance next visit  Further disposition pending results of labs. Discussed med's effects and SE's.   Over 30 minutes of exam, counseling, chart review, and critical decision making was performed.   Future Appointments  Date Time Provider Department Center  08/29/2021  9:00 AM [name], NP [facility] None    ------------------------------------------------------------------------------------------------------------------   HPI  30 y.o.male presents for follow up on anxiety, ADD, underweight, vit D def.   Currently uninsured, will be getting in 15 days  with new job.   He reports chronic anxiety following smoking cessation/chantix several years ago; he presented reporting panic attacks, also noting agitation with home "chaos" - fiance has Autistic and very young high energy child. He is now on lexapro 10 mg with significant perceived improvement, doesn't lash out at all recently, SO is much happier as well. He reports has also started exercising.   He has long hx of ADD, was prescribed adderall 30 mg XR by psych office but felt this was contributing to anxiety, fiance told him he "acts odd" while on it, also lost insurance and couldn't afford. He was prescribed adderall 10 mg daily last OV. He reports initially worked well but rapidly has noted decline in benefit, will take at 9 AM and need second dose by 1 pm, impacting job.   BMI is Body mass index is 17.16 kg/m., he has been working on diet and exercise, trying to eat less processed, eats 3 full meals most days, has always been similar weight, wants to start working on to gain muscle. Strategies discussed - needs caloric surplus. Has had normal TSH.  Wt Readings from Last 3 Encounters:  05/16/21 123 lb (55.8 kg)  02/19/21 114 lb (51.7 kg)  08/11/19 120 lb 6.4 oz (54.6 kg)   Patient is on Vitamin D supplement, completed 2 months of 26415 IU ?, now taking vitamin D in multivitamin. Has noted benefit with energy levels.  Lab Results  Component Value Date   VD25OH 26 (L) 08/11/2019       Past Medical History:  Diagnosis Date   ADD (attention deficit disorder)    Right inguinal hernia 12/09/2017   Volvulus (HCC) 06/07/2019     No Known  Allergies  Current Outpatient Medications on File Prior to Visit  Medication Sig   amphetamine-dextroamphetamine (ADDERALL) 10 MG tablet Take 1 tab in the morning as needed for focus; try to limit to <5 days/week.   Cholecalciferol 1.25 MG (50000 UT) capsule Take one tablet by mouth three days a week for twelve weeks. (Patient taking differently: 2,000  Units. Take one tablet daily)   escitalopram (LEXAPRO) 10 MG tablet Take 1 tablet (10 mg total) by mouth daily.   Multiple Vitamin (MULTIVITAMIN ADULT PO) Take by mouth.   Omega-3 Fatty Acids (FISH OIL PO) Take by mouth.   acetaminophen (TYLENOL) 500 MG tablet Tylenol 1000 mg every 8 hours as needed for pain.  You can alternate this with ibuprofen.  Do not exceed 4000 mg of Tylenol per day.  You can buy this over-the-counter at any drugstore. (Patient not taking: Reported on 05/16/2021)   No current facility-administered medications on file prior to visit.    ROS: all negative except above.   Physical Exam:  BP 108/62   Pulse 95   Temp 97.7 F (36.5 C)   Wt 123 lb (55.8 kg)   SpO2 99%   BMI 17.16 kg/m   General Appearance: Very thin adult male, good hygiene, appears younger than stated age, in no apparent distress. Eyes: PERRLA, EOMs, conjunctiva no swelling or erythema Sinuses: No Frontal/maxillary tenderness ENT/Mouth: Ext aud canals clear, TMs without erythema, bulging. No erythema, swelling, or exudate on post pharynx.  Tonsils not swollen or erythematous. Hearing normal.  Neck: Supple, thyroid normal.  Respiratory: Respiratory effort normal, BS equal bilaterally without rales, rhonchi, wheezing or stridor.  Cardio: RRR with no MRGs. Brisk peripheral pulses without edema.  Abdomen: Soft, + BS.  Non tender, no guarding, rebound, hernias, masses. Lymphatics: Non tender without lymphadenopathy.  Musculoskeletal: Full ROM, 5/5 strength, normal gait.  Skin: Warm, dry without rashes, lesions, ecchymosis.  Neuro: Cranial nerves intact. Normal muscle tone, no cerebellar symptoms. Sensation intact.  Psych: Awake and oriented X 3, normal affect, non-pressured speech, Insight and Judgment appropriate.    [name], NP 11:28 AM [facility]

## 2021-06-13 ENCOUNTER — Telehealth: Payer: Self-pay | Admitting: Adult Health

## 2021-06-13 ENCOUNTER — Other Ambulatory Visit: Payer: Self-pay | Admitting: Adult Health

## 2021-06-13 DIAGNOSIS — F988 Other specified behavioral and emotional disorders with onset usually occurring in childhood and adolescence: Secondary | ICD-10-CM

## 2021-06-13 MED ORDER — AMPHETAMINE-DEXTROAMPHETAMINE 20 MG PO TABS: ORAL_TABLET | ORAL | 0 refills | Status: DC

## 2021-06-13 NOTE — Telephone Encounter (Signed)
Patient is calling and asking for a refill of Adderall  [facility] [id number] [place], [place] - [address] [place] [address] Phone: [phone] Fax: [phone]    Recent Visits Date Type Provider Dept  05/16/21 Office Visit [name], NP [facility]  02/19/21 Office Visit [name], NP [facility]  Showing recent visits within past 540 days with a meds authorizing provider and meeting all other requirements Future Appointments Date Type Provider Dept  08/23/21 Appointment [name], NP [facility]  08/29/21 Appointment [name], NP [facility]  Showing future appointments within next 150 days with a meds authorizing provider and meeting all other requirements

## 2021-06-13 NOTE — Progress Notes (Signed)
Future Appointments  Date Time Provider Department Center  08/23/2021  9:00 AM [name], NP [facility] None  08/29/2021  9:00 AM [name], NP [facility] None    PDMP reviewed for adderall refill request.

## 2021-07-15 ENCOUNTER — Telehealth: Payer: Self-pay

## 2021-07-15 ENCOUNTER — Other Ambulatory Visit: Payer: Self-pay | Admitting: Adult Health

## 2021-07-15 DIAGNOSIS — F988 Other specified behavioral and emotional disorders with onset usually occurring in childhood and adolescence: Secondary | ICD-10-CM

## 2021-07-15 MED ORDER — AMPHETAMINE-DEXTROAMPHETAMINE 20 MG PO TABS
ORAL_TABLET | ORAL | 0 refills | Status: DC
Start: 2021-07-15 — End: 2021-08-13

## 2021-07-15 NOTE — Telephone Encounter (Signed)
Adderall refill request 

## 2021-07-15 NOTE — Progress Notes (Signed)
Future Appointments  Date Time Provider Department Center  08/23/2021  9:00 AM [name], NP [facility] None    PDMP reviewed for adderall refill request.

## 2021-07-23 ENCOUNTER — Telehealth: Payer: Self-pay

## 2021-07-23 ENCOUNTER — Other Ambulatory Visit: Payer: Self-pay | Admitting: Adult Health

## 2021-07-23 DIAGNOSIS — F410 Panic disorder [episodic paroxysmal anxiety] without agoraphobia: Secondary | ICD-10-CM

## 2021-07-23 MED ORDER — ALPRAZOLAM 1 MG PO TABS: ORAL_TABLET | ORAL | 0 refills | Status: DC

## 2021-07-23 NOTE — Telephone Encounter (Signed)
Patient states that he was told that if he needed a few Xanax, Morrie Sheldon would fill it for him. States he is having a procedure done to a tooth and can't sit still.

## 2021-07-23 NOTE — Progress Notes (Signed)
Future Appointments  Date Time Provider Department Center  08/23/2021  9:00 AM [name], NP [facility] None   PDMP reviewed for alprazolam refill request.

## 2021-08-12 ENCOUNTER — Telehealth: Payer: Self-pay

## 2021-08-12 NOTE — Telephone Encounter (Signed)
Refill request for Adderall

## 2021-08-13 ENCOUNTER — Other Ambulatory Visit: Payer: Self-pay | Admitting: Adult Health

## 2021-08-13 DIAGNOSIS — F988 Other specified behavioral and emotional disorders with onset usually occurring in childhood and adolescence: Secondary | ICD-10-CM

## 2021-08-13 MED ORDER — AMPHETAMINE-DEXTROAMPHETAMINE 20 MG PO TABS: ORAL_TABLET | ORAL | 0 refills | Status: DC

## 2021-08-13 NOTE — Progress Notes (Signed)
Future Appointments  Date Time Provider Department Center  08/23/2021  9:00 AM Joyce Heitman, NP [facility] None    PDMP reviewed for adderall refill request.   

## 2021-08-23 ENCOUNTER — Encounter: Payer: Self-pay | Admitting: Adult Health

## 2021-08-29 ENCOUNTER — Encounter: Payer: Self-pay | Admitting: Adult Health

## 2021-09-04 ENCOUNTER — Telehealth: Payer: Self-pay

## 2021-09-04 ENCOUNTER — Other Ambulatory Visit: Payer: Self-pay | Admitting: Adult Health

## 2021-09-04 DIAGNOSIS — F410 Panic disorder [episodic paroxysmal anxiety] without agoraphobia: Secondary | ICD-10-CM

## 2021-09-04 MED ORDER — ALPRAZOLAM 1 MG PO TABS: ORAL_TABLET | ORAL | 0 refills | Status: DC

## 2021-09-04 NOTE — Telephone Encounter (Signed)
Patient has an appointment tomorrow and if he will be having labs done and is requesting meds for that.

## 2021-09-05 ENCOUNTER — Other Ambulatory Visit: Payer: Self-pay

## 2021-09-05 ENCOUNTER — Encounter: Payer: Self-pay | Admitting: Adult Health

## 2021-09-05 ENCOUNTER — Ambulatory Visit (INDEPENDENT_AMBULATORY_CARE_PROVIDER_SITE_OTHER): Payer: Self-pay | Admitting: Adult Health

## 2021-09-05 VITALS — BP 118/68 | HR 101 | Temp 97.7°F | Ht 71.0 in | Wt 118.0 lb

## 2021-09-05 DIAGNOSIS — Z7289 Other problems related to lifestyle: Secondary | ICD-10-CM | POA: Insufficient documentation

## 2021-09-05 DIAGNOSIS — Z0001 Encounter for general adult medical examination with abnormal findings: Secondary | ICD-10-CM

## 2021-09-05 DIAGNOSIS — E559 Vitamin D deficiency, unspecified: Secondary | ICD-10-CM

## 2021-09-05 DIAGNOSIS — F988 Other specified behavioral and emotional disorders with onset usually occurring in childhood and adolescence: Secondary | ICD-10-CM

## 2021-09-05 DIAGNOSIS — Z681 Body mass index (BMI) 19 or less, adult: Secondary | ICD-10-CM

## 2021-09-05 DIAGNOSIS — F419 Anxiety disorder, unspecified: Secondary | ICD-10-CM

## 2021-09-05 MED ORDER — AMPHETAMINE-DEXTROAMPHET ER 20 MG PO CP24: 20.0000 mg | ORAL_CAPSULE | Freq: Every day | ORAL | 0 refills | Status: DC

## 2021-09-05 MED ORDER — ESCITALOPRAM OXALATE 10 MG PO TABS: 10.0000 mg | ORAL_TABLET | Freq: Every day | ORAL | 3 refills | Status: DC

## 2021-09-05 NOTE — Patient Instructions (Signed)
Know what a healthy weight is for you (roughly BMI <25) and aim to maintain this  Aim for 7+ servings of fruits and vegetables daily  65-80+ fluid ounces of water or unsweet tea for healthy kidneys  Limit to max 1 drink of alcohol per day; avoid smoking/tobacco  Limit animal fats in diet for cholesterol and heart health - choose grass fed whenever available  Avoid highly processed foods, and foods high in saturated/trans fats  Aim for low stress - take time to unwind and care for your mental health  Aim for 150 min of moderate intensity exercise weekly for heart health, and weights twice weekly for bone health  Aim for 7-9 hours of sleep daily     High-Fiber Eating Plan Fiber, also called dietary fiber, is a type of carbohydrate. It is found foods such as fruits, vegetables, whole grains, and beans. A high-fiber diet can have many health benefits. Your health care provider may recommend a high-fiber diet to help: Prevent constipation. Fiber can make your bowel movements more regular. Lower your cholesterol. Relieve the following conditions: Inflammation of veins in the anus (hemorrhoids). Inflammation of specific areas of the digestive tract (uncomplicated diverticulosis). A problem of the large intestine, also called the colon, that sometimes causes pain and diarrhea (irritable bowel syndrome, or IBS). Prevent overeating as part of a weight-loss plan. Prevent heart disease, type 2 diabetes, and certain cancers. What are tips for following this plan? Reading food labels  Check the nutrition facts label on food products for the amount of dietary fiber. Choose foods that have 5 grams of fiber or more per serving. The goals for recommended daily fiber intake include: Men (age 31 or younger): 34-38 g. Men (over age 31): 28-34 g. Women (age 31 or younger): 25-28 g. Women (over age 31): 22-25 g. Your daily fiber goal is _____________ g. Shopping Choose whole fruits and vegetables  instead of processed forms, such as apple juice or applesauce. Choose a wide variety of high-fiber foods such as avocados, lentils, oats, and kidney beans. Read the nutrition facts label of the foods you choose. Be aware of foods with added fiber. These foods often have high sugar and sodium amounts per serving. Cooking Use whole-grain flour for baking and cooking. Cook with brown rice instead of white rice. Meal planning Start the day with a breakfast that is high in fiber, such as a cereal that contains 5 g of fiber or more per serving. Eat breads and cereals that are made with whole-grain flour instead of refined flour or white flour. Eat brown rice, bulgur wheat, or millet instead of white rice. Use beans in place of meat in soups, salads, and pasta dishes. Be sure that half of the grains you eat each day are whole grains. General information You can get the recommended daily intake of dietary fiber by: Eating a variety of fruits, vegetables, grains, nuts, and beans. Taking a fiber supplement if you are not able to take in enough fiber in your diet. It is better to get fiber through food than from a supplement. Gradually increase how much fiber you consume. If you increase your intake of dietary fiber too quickly, you may have bloating, cramping, or gas. Drink plenty of water to help you digest fiber. Choose high-fiber snacks, such as berries, raw vegetables, nuts, and popcorn. What foods should I eat? Fruits Berries. Pears. Apples. Oranges. Avocado. Prunes and raisins. Dried figs. Vegetables Sweet potatoes. Spinach. Kale. Artichokes. Cabbage. Broccoli. Cauliflower. Green peas.  Carrots. Squash. Grains Whole-grain breads. Multigrain cereal. Oats and oatmeal. Brown rice. Barley. Bulgur wheat. Millet. Quinoa. Bran muffins. Popcorn. Rye wafer crackers. Meats and other proteins Navy beans, kidney beans, and pinto beans. Soybeans. Split peas. Lentils. Nuts and seeds. Dairy Fiber-fortified  yogurt. Beverages Fiber-fortified soy milk. Fiber-fortified orange juice. Other foods Fiber bars. The items listed above may not be a complete list of recommended foods and beverages. Contact a dietitian for more information. What foods should I avoid? Fruits Fruit juice. Cooked, strained fruit. Vegetables Fried potatoes. Canned vegetables. Well-cooked vegetables. Grains White bread. Pasta made with refined flour. White rice. Meats and other proteins Fatty cuts of meat. Fried chicken or fried fish. Dairy Milk. Yogurt. Cream cheese. Sour cream. Fats and oils Butters. Beverages Soft drinks. Other foods Cakes and pastries. The items listed above may not be a complete list of foods and beverages to avoid. Talk with your dietitian about what choices are best for you. Summary Fiber is a type of carbohydrate. It is found in foods such as fruits, vegetables, whole grains, and beans. A high-fiber diet has many benefits. It can help to prevent constipation, lower blood cholesterol, aid weight loss, and reduce your risk of heart disease, diabetes, and certain cancers. Increase your intake of fiber gradually. Increasing fiber too quickly may cause cramping, bloating, and gas. Drink plenty of water while you increase the amount of fiber you consume. The best sources of fiber include whole fruits and vegetables, whole grains, nuts, seeds, and beans. This information is not intended to replace advice given to you by your health care provider. Make sure you discuss any questions you have with your health care provider. Document Revised: 03/08/2020 Document Reviewed: 03/08/2020 Elsevier Patient Education  2022 [employer].

## 2021-09-05 NOTE — Progress Notes (Signed)
Complete Physical  Assessment and Plan:  Austin [name] was seen today for annual exam.  Diagnoses and all orders for this visit:  Encounter for routine adult health examination with abnormal findings Due annually  Health Maintenance reviewed Healthy lifestyle reviewed and goals set  Attention deficit disorder, unspecified hyperactivity presence Switch back to XR with new insurance Helps with focus, no AE's. The patient was counseled on the addictive nature of the medication and was encouraged to take drug holidays when not needed.  -     amphetamine-dextroamphetamine (ADDERALL XR) 20 MG 24 hr capsule; Take 1 capsule (20 mg total) by mouth daily.  Anxiety Well managed by current regimen; continue medications Stress management techniques discussed, increase water, good sleep hygiene discussed, increase exercise, and increase veggies.  -     escitalopram (LEXAPRO) 10 MG tablet; Take 1 tablet (10 mg total) by mouth daily.  BMI less than 19,adult Father with similar body habitus; patient reports low body weight his whole life Has had normal thyroid checks Long discussion about weight, diet, and exercise Recommended diet heavy in fruits and veggies and low in animal meats, cheeses, and dairy products, appropriate calorie intake Aim for caloric surplus, add resistance exercises Discussed appropriate weight for height  Follow up at next visit  Vitamin D deficiency Restart supplement, suggest 2000 IU daily  Check levels in 6 months  Current every day vaping Discussed risks associated with tobacco use and advised to reduce or quit Patient is ready to do so and plans to continue slow taper Declines Chantix or other medication Strategies discussed; wife is helping with accountability  Will follow up at the next visit   Discussed med's effects and SE's. Screening labs and tests as requested with regular follow-up as recommended. Over 40 minutes of exam, counseling, chart review and critical  decision making was performed  Future Appointments  Date Time Provider Department Center  03/04/2022  3:00 PM [name], NP [facility] None     HPI 30 y.o. male patient presents for a complete physical. She has ADD (attention deficit disorder); Anxiety; BMI less than 19,adult; Protein-calorie malnutrition, severe; and Vitamin D deficiency on their problem list. 06/07/19 volvulus which resulted in bowel resection. He had prior double hernia repair.  Has done well since, takes miralax as needed.    He has fiance, she is Autistic, has 88 y/o daughter. He has new job but Information systems manager.   He reports chronic anxiety following smoking cessation/chantix several years ago; much improved with lexapro this year. He does have xanax to use prior to blood draws.   He quit smoking but has been vaping; working to taper down.   He has long hx of ADD, was prescribed adderall 30 mg XR by psych office but lost insurance and has been on the IR, notes a crash with this and would like to switch back but would prefer lower dose.    BMI is Body mass index is 16.46 kg/m., he has been working on diet and exercise. Trying to eat less processed, eats 3 full meals most days, has always been similar weight, wants to start working on to gain muscle. Strategies discussed - needs caloric surplus. Has had normal TSH.  Wt Readings from Last 3 Encounters:  09/05/21 118 lb (53.5 kg)  05/16/21 123 lb (55.8 kg)  02/19/21 114 lb (51.7 kg)   Today their BP is BP: 118/68 He does workout. He denies chest pain, shortness of breath, dizziness.   The cholesterol last visit was:  Lab Results  Component Value Date   CHOL 133 07/06/2014   HDL 51 07/06/2014   LDLCALC 57 07/06/2014   TRIG 125 07/06/2014   CHOLHDL 2.6 07/06/2014   He has been working on diet and exercise for glucose management. Denies diabetic polys. Dad was diagnosed with T2DM later in life. Last A1C in the office was:  Lab Results  Component Value Date    HGBA1C 4.9 07/06/2014   Last GFR: Lab Results  Component Value Date   GFRNONAA 91 08/11/2019   Patient is not currently on Vitamin D supplement.   Lab Results  Component Value Date   VD25OH 26 (L) 08/11/2019       Current Medications:  Current Outpatient Medications on File Prior to Visit  Medication Sig Dispense Refill   ALPRAZolam (XANAX) 1 MG tablet TAKE 1/2-1 TAB PRIOR TO BLOOD DRAWS OR PROCEDURE AS NEEDED FOR ANXIETY/PANIC ATTACKS. DO NOT DRIVE AFTER TAKING THIS MEDICATION. 2 tablet 0   Multiple Vitamin (MULTIVITAMIN ADULT PO) Take by mouth.     Omega-3 Fatty Acids (FISH OIL PO) Take by mouth.     No current facility-administered medications on file prior to visit.   Allergies:  No Known Allergies Health Maintenance:  Immunization History  Administered Date(s) Administered   Tdap 01/15/2021    Tetanus: 2022 Pneumovax: defer Flu vaccine: defer, will get at pharmacy Covid 19: declines   DEXA: -  Colonoscopy: - due age 69 EGD: -   Eye Exam: last 2022, has glasses Dentist: Dr. Arman Filter, last 2022, goes q48m  Patient Care Team: Lucky Cowboy, MD as PCP - General (Internal Medicine) Karie Soda, MD as Consulting Physician (General Surgery) [name], NP as Nurse Practitioner (Nurse Practitioner)  Medical History:  has ADD (attention deficit disorder); Anxiety; BMI less than 19,adult; Protein-calorie malnutrition, severe; and Vitamin D deficiency on their problem list. Surgical History:  He  has a past surgical history that includes Mouth surgery; Fetal surgery for congenital hernia; laparotomy (Right, 06/07/2019); Partial colectomy (Right, 06/07/2019); and Hernia repair (Bilateral, 2019). Family History:  His family history includes Anxiety disorder in his brother; Diabetes in his father; Hypertension in his father; Lung cancer in his paternal grandmother. Social History:   reports that he has been smoking e-cigarettes and cigarettes. He started  smoking about 12 years ago. He has a 3.75 pack-year smoking history. He has never used smokeless tobacco. He reports that he does not drink alcohol and does not use drugs.  Review of Systems:  Review of Systems  Constitutional:  Negative for malaise/fatigue and weight loss.  HENT:  Negative for hearing loss and tinnitus.   Eyes:  Negative for blurred vision and double vision.  Respiratory:  Negative for cough, sputum production, shortness of breath and wheezing.   Cardiovascular:  Negative for chest pain, palpitations, orthopnea, claudication, leg swelling and PND.  Gastrointestinal:  Negative for abdominal pain, blood in stool, constipation, diarrhea, heartburn, melena, nausea and vomiting.  Genitourinary: Negative.   Musculoskeletal:  Negative for falls, joint pain and myalgias.  Skin:  Negative for rash.  Neurological:  Negative for dizziness, tingling, sensory change, weakness and headaches.  Endo/Heme/Allergies:  Negative for polydipsia.  Psychiatric/Behavioral:  Negative for depression, memory loss, substance abuse and suicidal ideas. The patient is nervous/anxious (med procedures/blood draws). The patient does not have insomnia.   All other systems reviewed and are negative.  Physical Exam: Estimated body mass index is 16.46 kg/m as calculated from the following:   Height as of  this encounter: 5\' 11"  (1.803 m).   Weight as of this encounter: 118 lb (53.5 kg). BP 118/68   Pulse (!) 101   Temp 97.7 F (36.5 C)   Ht 5\' 11"  (1.803 m)   Wt 118 lb (53.5 kg)   SpO2 98%   BMI 16.46 kg/m  General Appearance: Very thin adult male, well dressed, good hygiene, in no apparent distress.  Eyes: PERRLA, EOMs, conjunctiva no swelling or erythema Sinuses: No Frontal/maxillary tenderness  ENT/Mouth: Ext aud canals clear, normal light reflex with TMs without erythema, bulging. Good dentition. No erythema, swelling, or exudate on post pharynx. Tonsils not swollen or erythematous. Hearing normal.   Neck: Supple, thyroid normal. No bruits  Respiratory: Respiratory effort normal, BS equal bilaterally without rales, rhonchi, wheezing or stridor.  Cardio: RRR without murmurs, rubs or gallops. Brisk peripheral pulses without edema.  Chest: symmetric, with normal excursions and percussion.  Abdomen: Soft, nontender, no guarding, rebound, hernias, masses, or organomegaly. Well healed midline surgical scar.  Lymphatics: Non tender without lymphadenopathy.  Genitourinary: declines, no concerns on self checks Musculoskeletal: Full ROM all peripheral extremities,5/5 strength, and normal gait.  Skin: Warm, dry without rashes, lesions, ecchymosis. Neuro: Cranial nerves intact, reflexes equal bilaterally. Normal muscle tone, no cerebellar symptoms. Sensation intact.  Psych: Awake and oriented X 3, normal affect, Insight and Judgment appropriate.   EKG: Defer  5:11 PM [facility]

## 2021-09-12 ENCOUNTER — Other Ambulatory Visit: Payer: Self-pay | Admitting: Adult Health

## 2021-09-12 ENCOUNTER — Telehealth: Payer: Self-pay

## 2021-09-12 DIAGNOSIS — F988 Other specified behavioral and emotional disorders with onset usually occurring in childhood and adolescence: Secondary | ICD-10-CM

## 2021-09-12 MED ORDER — AMPHETAMINE-DEXTROAMPHET ER 30 MG PO CP24: ORAL_CAPSULE | ORAL | 0 refills | Status: DC

## 2021-09-12 NOTE — Telephone Encounter (Signed)
Requesting a prescription for 30mg  of Adderall instead of 20mg . States that its not working.

## 2021-09-12 NOTE — Progress Notes (Signed)
Future Appointments  Date Time Provider Department Center  03/04/2022  3:00 PM [name], NP [facility] None   PDMP reviewed for adderall dose change request.

## 2021-09-30 ENCOUNTER — Telehealth: Payer: Self-pay

## 2021-09-30 NOTE — Telephone Encounter (Signed)
Requesting to switch back to Adderall instant release 20mg  BID.

## 2021-10-01 ENCOUNTER — Other Ambulatory Visit: Payer: Self-pay | Admitting: Adult Health

## 2021-10-01 DIAGNOSIS — F988 Other specified behavioral and emotional disorders with onset usually occurring in childhood and adolescence: Secondary | ICD-10-CM

## 2021-10-01 MED ORDER — AMPHETAMINE-DEXTROAMPHETAMINE 20 MG PO TABS: ORAL_TABLET | ORAL | 0 refills | Status: DC

## 2021-10-28 ENCOUNTER — Other Ambulatory Visit: Payer: Self-pay | Admitting: Nurse Practitioner

## 2021-10-28 ENCOUNTER — Telehealth: Payer: Self-pay

## 2021-10-28 DIAGNOSIS — F988 Other specified behavioral and emotional disorders with onset usually occurring in childhood and adolescence: Secondary | ICD-10-CM

## 2021-10-28 MED ORDER — AMPHETAMINE-DEXTROAMPHETAMINE 20 MG PO TABS: ORAL_TABLET | ORAL | 0 refills | Status: DC

## 2021-10-28 NOTE — Telephone Encounter (Signed)
Patient requesting refill on Adderall   Austin [name]

## 2021-11-26 ENCOUNTER — Telehealth: Payer: Self-pay

## 2021-11-26 ENCOUNTER — Other Ambulatory Visit: Payer: Self-pay | Admitting: Adult Health

## 2021-11-26 DIAGNOSIS — F988 Other specified behavioral and emotional disorders with onset usually occurring in childhood and adolescence: Secondary | ICD-10-CM

## 2021-11-26 MED ORDER — AMPHETAMINE-DEXTROAMPHETAMINE 20 MG PO TABS: ORAL_TABLET | ORAL | 0 refills | Status: DC

## 2021-11-26 NOTE — Telephone Encounter (Signed)
Adderall refill request.   Austin [name], [address]

## 2021-11-26 NOTE — Progress Notes (Signed)
Future Appointments  ?Date Time Provider Department Center  ?03/04/2022  3:00 PM Deepa Barthel, NP [facility] None  ? ? ?PDMP reviewed for adderall refill request.  ? ?

## 2021-11-27 ENCOUNTER — Other Ambulatory Visit: Payer: Self-pay | Admitting: Adult Health

## 2021-11-27 DIAGNOSIS — F988 Other specified behavioral and emotional disorders with onset usually occurring in childhood and adolescence: Secondary | ICD-10-CM

## 2021-11-28 ENCOUNTER — Other Ambulatory Visit: Payer: Self-pay | Admitting: Adult Health

## 2021-11-28 DIAGNOSIS — F988 Other specified behavioral and emotional disorders with onset usually occurring in childhood and adolescence: Secondary | ICD-10-CM

## 2021-11-28 MED ORDER — AMPHETAMINE-DEXTROAMPHETAMINE 20 MG PO TABS: ORAL_TABLET | ORAL | 0 refills | Status: DC

## 2021-11-28 NOTE — Progress Notes (Signed)
Future Appointments  Date Time Provider Department Center  03/04/2022  3:00 PM [name], NP [facility] None    PDMP reviewed for adderall refill request.

## 2021-12-25 ENCOUNTER — Other Ambulatory Visit: Payer: Self-pay | Admitting: Adult Health

## 2021-12-25 ENCOUNTER — Telehealth: Payer: Self-pay

## 2021-12-25 DIAGNOSIS — F988 Other specified behavioral and emotional disorders with onset usually occurring in childhood and adolescence: Secondary | ICD-10-CM

## 2021-12-25 MED ORDER — AMPHETAMINE-DEXTROAMPHETAMINE 20 MG PO TABS: ORAL_TABLET | ORAL | 0 refills | Status: DC

## 2021-12-25 NOTE — Telephone Encounter (Signed)
Adderall 20mg  refill request

## 2021-12-25 NOTE — Progress Notes (Signed)
Future Appointments  ?Date Time Provider Department Center  ?03/04/2022  3:00 PM Abem Shaddix, NP [facility] None  ? ? ?PDMP reviewed for adderall refill request.  ? ?

## 2021-12-26 ENCOUNTER — Other Ambulatory Visit: Payer: Self-pay | Admitting: Adult Health

## 2021-12-26 DIAGNOSIS — F988 Other specified behavioral and emotional disorders with onset usually occurring in childhood and adolescence: Secondary | ICD-10-CM

## 2021-12-26 MED ORDER — AMPHETAMINE-DEXTROAMPHETAMINE 20 MG PO TABS: ORAL_TABLET | ORAL | 0 refills | Status: DC

## 2021-12-26 NOTE — Progress Notes (Signed)
Future Appointments  Date Time Provider [place]  03/04/2022  3:00 PM Liane Comber, NP [facility] None    PDMP reviewed for adderall refill request to send to new pharmacy, usual pharmacy out of stock per patient.

## 2022-01-21 ENCOUNTER — Telehealth: Payer: Self-pay | Admitting: Adult Health

## 2022-01-21 NOTE — Telephone Encounter (Signed)
REQUESTING ADDERALL REFILL TO BE SENT TO [facility] [address]  ?

## 2022-01-22 ENCOUNTER — Other Ambulatory Visit: Payer: Self-pay | Admitting: Adult Health

## 2022-01-22 DIAGNOSIS — F988 Other specified behavioral and emotional disorders with onset usually occurring in childhood and adolescence: Secondary | ICD-10-CM

## 2022-01-22 MED ORDER — AMPHETAMINE-DEXTROAMPHETAMINE 20 MG PO TABS: ORAL_TABLET | ORAL | 0 refills | Status: DC

## 2022-01-22 NOTE — Progress Notes (Signed)
Future Appointments  ?Date Time Provider Department Center  ?03/04/2022  3:00 PM Monya Kozakiewicz, NP [facility] None  ? ? ?PDMP reviewed for adderall refill request.  ? ?

## 2022-02-20 ENCOUNTER — Telehealth: Payer: Self-pay | Admitting: Adult Health

## 2022-02-20 ENCOUNTER — Other Ambulatory Visit: Payer: Self-pay | Admitting: Adult Health

## 2022-02-20 DIAGNOSIS — F988 Other specified behavioral and emotional disorders with onset usually occurring in childhood and adolescence: Secondary | ICD-10-CM

## 2022-02-20 MED ORDER — AMPHETAMINE-DEXTROAMPHETAMINE 20 MG PO TABS: ORAL_TABLET | ORAL | 0 refills | Status: DC

## 2022-02-20 NOTE — Telephone Encounter (Signed)
Patient is requesting refill on [facility] to [facility] [address]. -e welch ?

## 2022-02-20 NOTE — Progress Notes (Signed)
Future Appointments  ?Date Time Provider Department Center  ?03/04/2022  3:00 PM [name], NP [facility] None  ? ? ?PDMP reviewed for adderall refill request.  ? ?

## 2022-02-28 NOTE — Progress Notes (Signed)
Complete Physical ? ?Assessment and Plan: ? ?Austin [name] was seen today for annual exam. ? ?Diagnoses and all orders for this visit: ? ?Encounter for routine adult health examination with abnormal findings ?Due annually  ?Health Maintenance reviewed ?Healthy lifestyle reviewed and goals set ?- he will reach out to job about signing up for health insurance  ? ?Attention deficit disorder, unspecified hyperactivity presence ?Continue meds; monitor PDMPR closely  ?Helps with focus, no AE's. ?The patient was counseled on the addictive nature of the medication and was encouraged to take drug holidays when not needed.  ?-     amphetamine-dextroamphetamine (ADDERALL XR) 20 MG 24 hr capsule; Take 1 capsule (20 mg total) by mouth daily. ? ?Anxiety ?Well managed by current regimen; continue medications ?Stress management techniques discussed, increase water, good sleep hygiene discussed, increase exercise, and increase veggies.  ?-     escitalopram (LEXAPRO) 10 MG tablet; Take 1 tablet (10 mg total) by mouth daily. ? ?BMI less than 19,adult (BMI 15.87)/ protein calorie malnutrition ?Father with similar body habitus; patient reports low body weight his whole life but has been losing weight since job change ?Has had normal thyroid checks ?Long discussion about weight, diet, and exercise ?Recommended diet heavy in fruits and veggies and low in animal meats, cheeses, and dairy products, appropriate calorie intake ?Aim for caloric surplus, add resistance exercises ?Discussed appropriate weight for height ?Will try adding remeron in the evening after disucssion, follow up with progress in 1 month, goal 0.5-2 lb/week  ? ?Vitamin D deficiency ?Continue supplement; defer checking due to cash pay/cost ? ?Current every day vaping ?Discussed risks associated with tobacco use and advised to reduce or quit ?Patient is ready to do so and plans to continue slow taper ?Declines Chantix or other medication ?Strategies discussed; wife is helping with  accountability  ?Will follow up at the next visit ? ?Orders Placed This Encounter  ?Procedures  ? CBC with Differential/Platelet  ? COMPLETE METABOLIC PANEL WITH GFR  ? TSH  ? Urinalysis, Routine w reflex microscopic  ? ? ?Discussed med's effects and SE's. Screening labs and tests as requested with regular follow-up as recommended. ?Over 40 minutes of exam, counseling, chart review and critical decision making was performed ? ?Future Appointments  ?Date Time Provider [place]  ?03/05/2023  3:00 PM Liane Comber, NP [facility] None  ? ? ? ?HPI ?31 y.o. male patient presents for a complete physical. She has ADD (attention deficit disorder); Anxiety; BMI less than 19,adult; Protein-calorie malnutrition, severe; Vitamin D deficiency; and Current every day vaping on their problem list.  ? ?He has fiance, she is Autistic, has 28 y/o daughter. He has new job but Personnel officer., open ended contract work.  ? ?06/07/19 volvulus which resulted in bowel resection. He had prior double hernia repair.  Has done well since, takes miralax as needed.   ? ?He reports chronic anxiety following smoking cessation/chantix several years ago; much improved with lexapro. He does have xanax to use prior to blood draws.  ? ?He quit smoking but has been vaping; working to taper down.  ? ?He has long hx of ADD, was prescribed adderall 30 mg XR by psych office but lost insurance, currently on adderall IR 20 mg in AM on work days, if busy will take an additional 1/2 tab  ? ?BMI is Body mass index is 15.87 kg/m?., he has been working on diet and exercise. Was trying to eat less processed, eats 3 full meals most days, has always been  similar weight, wants to start working on to gain muscle. Strategies discussed - needs caloric surplus. Has had normal TSH.  ?Wt Readings from Last 3 Encounters:  ?03/04/22 113 lb 12.8 oz (51.6 kg)  ?09/05/21 118 lb (53.5 kg)  ?05/16/21 123 lb (55.8 kg)  ? ?Today their BP is BP: 108/76 ?He does workout. He  denies chest pain, shortness of breath, dizziness.  ? ?The cholesterol last visit was:   ?Lab Results  ?Component Value Date  ? CHOL 133 07/06/2014  ? HDL 51 07/06/2014  ? [place] 57 07/06/2014  ? TRIG 125 07/06/2014  ? CHOLHDL 2.6 07/06/2014  ? ?He has been working on diet and exercise for glucose management. Denies diabetic polys. Dad was diagnosed with T2DM later in life. Last A1C in the office was:  ?Lab Results  ?Component Value Date  ? HGBA1C 4.9 07/06/2014  ? ?Last GFR: ?Lab Results  ?Component Value Date  ? GFRNONAA 91 08/11/2019  ? ?Patient is not currently on Vitamin D supplement.   ?Lab Results  ?Component Value Date  ? VD25OH 26 (L) 08/11/2019  ?   ? ? ?Current Medications:  ?Current Outpatient Medications on File Prior to Visit  ?Medication Sig Dispense Refill  ? amphetamine-dextroamphetamine (ADDERALL) 20 MG tablet Take 1/2-1 tab twice daily as needed for focus; try to limit to <5 days/week. 55 tablet 0  ? escitalopram (LEXAPRO) 10 MG tablet Take 1 tablet (10 mg total) by mouth daily. 90 tablet 3  ? Multiple Vitamin (MULTIVITAMIN ADULT PO) Take by mouth. (Patient not taking: Reported on 03/04/2022)    ? Omega-3 Fatty Acids (FISH OIL PO) Take by mouth. (Patient not taking: Reported on 03/04/2022)    ? ?No current facility-administered medications on file prior to visit.  ? ?Allergies:  ?No Known Allergies ?Health Maintenance:  ?Immunization History  ?Administered Date(s) Administered  ? Tdap 01/15/2021  ? ?Health Maintenance  ?Topic Date Due  ? COVID-19 Vaccine (1) 03/20/2022 (Originally 09/21/1991)  ? INFLUENZA VACCINE  06/17/2022  ? TETANUS/TDAP  01/16/2031  ? Hepatitis C Screening  Completed  ? HIV Screening  Completed  ? HPV VACCINES  Aged Out  ? ?Tetanus: 2022 ?Covid 19: declines  ? ?Colonoscopy: - due age 77 ? ?Eye Exam: last 2022, has glasses ?Dentist: Dr. Renata Caprice, last 2022, goes q38m ? ?Patient Care Team: ?Unk Pinto, MD as PCP - General (Internal Medicine) ?Michael Boston, MD as  Consulting Physician (General Surgery) ?Liane Comber, NP as Nurse Practitioner (Nurse Practitioner) ? ?Medical History:  ?has ADD (attention deficit disorder); Anxiety; BMI less than 19,adult; Protein-calorie malnutrition, severe; Vitamin D deficiency; and Current every day vaping on their problem list. ?Surgical History:  ?He  has a past surgical history that includes Mouth surgery; Fetal surgery for congenital hernia; laparotomy (Right, 06/07/2019); Partial colectomy (Right, 06/07/2019); and Hernia repair (Bilateral, 2019). ?Family History:  ?His family history includes Alcohol abuse in his maternal grandmother; Anxiety disorder in his brother; Blindness in his maternal grandmother; Deep vein thrombosis in his maternal grandmother; Diabetes in his father; Hypertension in his father; Lung cancer in his paternal grandmother. ?Social History:  ? reports that he quit smoking about 8 years ago. His smoking use included e-cigarettes and cigarettes. He started smoking about 13 years ago. He has a 3.75 pack-year smoking history. He has never used smokeless tobacco. He reports that he does not drink alcohol and does not use drugs. ? ?Review of Systems:  ?Review of Systems  ?Constitutional:  Negative for malaise/fatigue and  weight loss.  ?HENT:  Negative for hearing loss and tinnitus.   ?Eyes:  Negative for blurred vision and double vision.  ?Respiratory:  Negative for cough, sputum production, shortness of breath and wheezing.   ?Cardiovascular:  Negative for chest pain, palpitations, orthopnea, claudication, leg swelling and PND.  ?Gastrointestinal:  Negative for abdominal pain, blood in stool, constipation, diarrhea, heartburn, melena, nausea and vomiting.  ?Genitourinary: Negative.   ?Musculoskeletal:  Negative for falls, joint pain and myalgias.  ?Skin:  Negative for rash.  ?Neurological:  Negative for dizziness, tingling, sensory change, weakness and headaches.  ?Endo/Heme/Allergies:  Negative for polydipsia.   ?Psychiatric/Behavioral:  Negative for depression, memory loss, substance abuse and suicidal ideas. The patient is nervous/anxious (med procedures/blood draws). The patient does not have insomnia.   ?All other syst

## 2022-03-03 ENCOUNTER — Telehealth: Payer: Self-pay | Admitting: Internal Medicine

## 2022-03-03 NOTE — Telephone Encounter (Signed)
Pt has a CPE tomorrow with Morrie Sheldon and prior to any bloodraw is prescribed xanax to help calm him down. Wanting to know of that can be called in for him to pick up before his appt.  ?

## 2022-03-04 ENCOUNTER — Ambulatory Visit (INDEPENDENT_AMBULATORY_CARE_PROVIDER_SITE_OTHER): Payer: Self-pay | Admitting: Adult Health

## 2022-03-04 ENCOUNTER — Encounter: Payer: Self-pay | Admitting: Adult Health

## 2022-03-04 ENCOUNTER — Other Ambulatory Visit: Payer: Self-pay | Admitting: Adult Health

## 2022-03-04 VITALS — BP 108/76 | HR 116 | Temp 97.7°F | Ht 71.0 in | Wt 113.8 lb

## 2022-03-04 DIAGNOSIS — Z0001 Encounter for general adult medical examination with abnormal findings: Secondary | ICD-10-CM

## 2022-03-04 DIAGNOSIS — F419 Anxiety disorder, unspecified: Secondary | ICD-10-CM

## 2022-03-04 DIAGNOSIS — Z681 Body mass index (BMI) 19 or less, adult: Secondary | ICD-10-CM

## 2022-03-04 DIAGNOSIS — F410 Panic disorder [episodic paroxysmal anxiety] without agoraphobia: Secondary | ICD-10-CM

## 2022-03-04 DIAGNOSIS — R636 Underweight: Secondary | ICD-10-CM | POA: Insufficient documentation

## 2022-03-04 DIAGNOSIS — Z7289 Other problems related to lifestyle: Secondary | ICD-10-CM

## 2022-03-04 DIAGNOSIS — E559 Vitamin D deficiency, unspecified: Secondary | ICD-10-CM

## 2022-03-04 DIAGNOSIS — Z1329 Encounter for screening for other suspected endocrine disorder: Secondary | ICD-10-CM

## 2022-03-04 DIAGNOSIS — F988 Other specified behavioral and emotional disorders with onset usually occurring in childhood and adolescence: Secondary | ICD-10-CM

## 2022-03-04 DIAGNOSIS — E43 Unspecified severe protein-calorie malnutrition: Secondary | ICD-10-CM

## 2022-03-04 MED ORDER — ALPRAZOLAM 1 MG PO TABS: ORAL_TABLET | ORAL | 0 refills | Status: DC

## 2022-03-04 MED ORDER — MIRTAZAPINE 15 MG PO TABS: ORAL_TABLET | ORAL | 1 refills | Status: DC

## 2022-03-04 NOTE — Patient Instructions (Addendum)
? ? ? ? ?Please call your HR to discuss insurance  ? ? ?Mirtazapine Tablets ?What is this medication? ?MIRTAZAPINE (mir TAZ a peen) treats depression. It is also prescribed to help with gaining weight.  ? ? It increases the amount of serotonin and norepinephrine in the brain, hormones that help regulate mood. ?This medicine may be used for other purposes; ask your health care provider or pharmacist if you have questions. ?COMMON BRAND NAME(S): Remeron ?What should I tell my care team before I take this medication? ?They need to know if you have any of these conditions: ?Bipolar disorder ?Glaucoma ?Kidney disease ?Liver disease ?Suicidal thoughts ?An unusual or allergic reaction to mirtazapine, other medications, foods, dyes, or preservatives ?Pregnant or trying to get pregnant ?Breast-feeding ?How should I use this medication? ?Take this medication by mouth with a glass of water. Follow the directions on the prescription label. Take your medication at regular intervals. Do not take your medication more often than directed. Do not stop taking this medication suddenly except upon the advice of your care team. Stopping this medication too quickly may cause serious side effects or your condition may worsen. ?A special MedGuide will be given to you by the pharmacist with each prescription and refill. Be sure to read this information carefully each time. ?Talk to your care team about the use of this medication in children. Special care may be needed. ?Overdosage: If you think you have taken too much of this medicine contact a poison control center or emergency room at once. ?NOTE: This medicine is only for you. Do not share this medicine with others. ?What if I miss a dose? ?If you miss a dose, take it as soon as you can. If it is almost time for your next dose, take only that dose. Do not take double or extra doses. ?What may interact with this medication? ?Do not take this medication with any of the  following: ?Linezolid ?MAOIs like Carbex, Eldepryl, Marplan, Nardil, and Parnate ?Methylene blue (injected into a vein) ?This medication may also interact with the following: ?Alcohol ?Antiviral medications for HIV or AIDS ?Certain medications that treat or prevent blood clots like warfarin ?Certain medications for depression, anxiety, or psychotic disturbances ?Certain medications for fungal infections like ketoconazole and itraconazole ?Certain medications for migraine headache like almotriptan, eletriptan, frovatriptan, naratriptan, rizatriptan, sumatriptan, zolmitriptan ?Certain medications for seizures like carbamazepine or phenytoin ?Certain medications for sleep ?Cimetidine ?Erythromycin ?Fentanyl ?Lithium ?Medications for blood pressure ?Nefazodone ?Rasagiline ?Rifampin ?Supplements like St. John's wort, kava kava, valerian ?Tramadol ?Tryptophan ?This list may not describe all possible interactions. Give your health care provider a list of all the medicines, herbs, non-prescription drugs, or dietary supplements you use. Also tell them if you smoke, drink alcohol, or use illegal drugs. Some items may interact with your medicine. ?What should I watch for while using this medication? ?Tell your care team if your symptoms do not get better or if they get worse. Visit your care team for regular checks on your progress. Because it may take several weeks to see the full effects of this medication, it is important to continue your treatment as prescribed by your doctor. ?Patients and their families should watch out for new or worsening thoughts of suicide or depression. Also watch out for sudden changes in feelings such as feeling anxious, agitated, panicky, irritable, hostile, aggressive, impulsive, severely restless, overly excited and hyperactive, or not being able to sleep. If this happens, especially at the beginning of treatment or after a  change in dose, call your care team. ?You may get drowsy or dizzy. Do  not drive, use machinery, or do anything that needs mental alertness until you know how this medication affects you. Do not stand or sit up quickly, especially if you are an older patient. This reduces the risk of dizzy or fainting spells. Alcohol may interfere with the effect of this medication. Avoid alcoholic drinks. ?This medication may cause dry eyes and blurred vision. If you wear contact lenses you may feel some discomfort. Lubricating drops may help. See your eye care team if the problem does not go away or is severe. ?Your mouth may get dry. Chewing sugarless gum or sucking hard candy, and drinking plenty of water may help. Contact your care team if the problem does not go away or is severe. ?What side effects may I notice from receiving this medication? ?Side effects that you should report to your care team as soon as possible: ?Allergic reactions--skin rash, itching, hives, swelling of the face, lips, tongue, or throat ?Heart rhythm changes--fast or irregular heartbeat, dizziness, feeling faint or lightheaded, chest pain, trouble breathing ?Infection--fever, chills, cough, or sore throat ?Irritability, confusion, fast or irregular heartbeat, muscle stiffness, twitching muscles, sweating, high fever, seizure, chills, vomiting, diarrhea, which may be signs of serotonin syndrome ?Low sodium level--muscle weakness, fatigue, dizziness, headache, confusion ?Rash, fever, and swollen lymph nodes ?Redness, blistering, peeling or loosening of the skin, including inside the mouth ?Seizures ?Sudden eye pain or change in vision such as blurry vision, seeing halos around lights, vision loss ?Thoughts of suicide or self-harm, worsening mood, feelings of depression ?Side effects that usually do not require medical attention (report to your care team if they continue or are bothersome): ?Constipation ?Dizziness ?Drowsiness ?Dry mouth ?Increase in appetite ?Weight gain ?This list may not describe all possible side effects.  Call your doctor for medical advice about side effects. You may report side effects to FDA at 1-800-FDA-1088. ?Where should I keep my medication? ?Keep out of the reach of children. ?Store at room temperature between 15 and 30 degrees C (59 and 86 degrees F) Protect from light and moisture. Throw away any unused medication after the expiration date. ?NOTE: This sheet is a summary. It may not cover all possible information. If you have questions about this medicine, talk to your doctor, pharmacist, or health care provider. ?? 2023 Elsevier/Gold Standard (2021-01-30 00:00:00) ? ? ?

## 2022-03-04 NOTE — Telephone Encounter (Signed)
Patient notified

## 2022-03-05 LAB — URINALYSIS, ROUTINE W REFLEX MICROSCOPIC
Bilirubin Urine: NEGATIVE
Glucose, UA: NEGATIVE
Hgb urine dipstick: NEGATIVE
Ketones, ur: NEGATIVE
Leukocytes,Ua: NEGATIVE
Nitrite: NEGATIVE
Protein, ur: NEGATIVE
Specific Gravity, Urine: 1.004 (ref 1.001–1.035)
pH: 6.5 (ref 5.0–8.0)

## 2022-03-05 LAB — COMPLETE METABOLIC PANEL WITH GFR
AG Ratio: 2.2 (calc) (ref 1.0–2.5)
ALT: 7 U/L — ABNORMAL LOW (ref 9–46)
AST: 12 U/L (ref 10–40)
Albumin: 4.3 g/dL (ref 3.6–5.1)
Alkaline phosphatase (APISO): 71 U/L (ref 36–130)
BUN: 8 mg/dL (ref 7–25)
CO2: 28 mmol/L (ref 20–32)
Calcium: 8.8 mg/dL (ref 8.6–10.3)
Chloride: 102 mmol/L (ref 98–110)
Creat: 1.14 mg/dL (ref 0.60–1.26)
Globulin: 2 g/dL (calc) (ref 1.9–3.7)
Glucose, Bld: 77 mg/dL (ref 65–99)
Potassium: 4 mmol/L (ref 3.5–5.3)
Sodium: 140 mmol/L (ref 135–146)
Total Bilirubin: 0.4 mg/dL (ref 0.2–1.2)
Total Protein: 6.3 g/dL (ref 6.1–8.1)
eGFR: 88 mL/min/{1.73_m2} (ref >=60)

## 2022-03-05 LAB — CBC WITH DIFFERENTIAL/PLATELET
Absolute Monocytes: 549 cells/uL (ref 200–950)
Basophils Absolute: 49 cells/uL (ref 0–200)
Basophils Relative: 0.8 %
Eosinophils Absolute: 207 cells/uL (ref 15–500)
Eosinophils Relative: 3.4 %
HCT: 41.7 % (ref 38.5–50.0)
Hemoglobin: 14.2 g/dL (ref 13.2–17.1)
Lymphs Abs: 1104 cells/uL (ref 850–3900)
MCH: 30.9 pg (ref 27.0–33.0)
MCHC: 34.1 g/dL (ref 32.0–36.0)
MCV: 90.7 fL (ref 80.0–100.0)
MPV: 11.6 fL (ref 7.5–12.5)
Monocytes Relative: 9 %
Neutro Abs: 4191 cells/uL (ref 1500–7800)
Neutrophils Relative %: 68.7 %
Platelets: 203 10*3/uL (ref 140–400)
RBC: 4.6 10*6/uL (ref 4.20–5.80)
RDW: 11.9 % (ref 11.0–15.0)
Total Lymphocyte: 18.1 %
WBC: 6.1 10*3/uL (ref 3.8–10.8)

## 2022-03-05 LAB — TSH: TSH: 1.2 m[IU]/L (ref 0.40–4.50)

## 2022-03-25 ENCOUNTER — Other Ambulatory Visit: Payer: Self-pay | Admitting: Adult Health

## 2022-03-25 ENCOUNTER — Telehealth: Payer: Self-pay | Admitting: Adult Health

## 2022-03-25 DIAGNOSIS — F988 Other specified behavioral and emotional disorders with onset usually occurring in childhood and adolescence: Secondary | ICD-10-CM

## 2022-03-25 MED ORDER — AMPHETAMINE-DEXTROAMPHETAMINE 20 MG PO TABS: ORAL_TABLET | ORAL | 0 refills | Status: DC

## 2022-03-25 NOTE — Telephone Encounter (Signed)
Pt is requesting refill on Adderall [facility] [place] [facility]  ?

## 2022-03-25 NOTE — Progress Notes (Signed)
Future Appointments  Date Time Provider Department Center  09/03/2022  9:30 AM Cranford, Tonya, NP [facility] None  03/05/2023  3:00 PM Linsie Lupo, NP [facility] None    PDMP reviewed for adderall refill request.   

## 2022-04-22 ENCOUNTER — Telehealth: Payer: Self-pay

## 2022-04-22 ENCOUNTER — Other Ambulatory Visit: Payer: Self-pay | Admitting: Adult Health

## 2022-04-22 DIAGNOSIS — F988 Other specified behavioral and emotional disorders with onset usually occurring in childhood and adolescence: Secondary | ICD-10-CM

## 2022-04-22 MED ORDER — AMPHETAMINE-DEXTROAMPHETAMINE 20 MG PO TABS: ORAL_TABLET | ORAL | 0 refills | Status: DC

## 2022-04-22 NOTE — Progress Notes (Signed)
Future Appointments  Date Time Provider [place]  09/03/2022  9:30 AM Darrol Jump, NP [facility] None  03/05/2023  3:00 PM Liane Comber, NP [facility] None    PDMP reviewed for adderall refill request.

## 2022-04-22 NOTE — Telephone Encounter (Signed)
Adderall refill request 

## 2022-04-23 ENCOUNTER — Other Ambulatory Visit: Payer: Self-pay | Admitting: Adult Health

## 2022-04-23 DIAGNOSIS — F988 Other specified behavioral and emotional disorders with onset usually occurring in childhood and adolescence: Secondary | ICD-10-CM

## 2022-04-23 MED ORDER — AMPHETAMINE-DEXTROAMPHETAMINE 20 MG PO TABS: ORAL_TABLET | ORAL | 0 refills | Status: DC

## 2022-04-23 MED ORDER — MIRTAZAPINE 15 MG PO TABS: ORAL_TABLET | ORAL | 2 refills | Status: DC

## 2022-04-23 NOTE — Progress Notes (Signed)
Future Appointments  Date Time Provider Department Center  09/03/2022  9:30 AM [name], NP [facility] None  03/05/2023  3:00 PM [name], NP [facility] None    PDMP reviewed for adderall refill request to be [facility] to [facility] [address]

## 2022-05-22 ENCOUNTER — Telehealth: Payer: Self-pay | Admitting: Adult Health

## 2022-05-22 ENCOUNTER — Other Ambulatory Visit: Payer: Self-pay | Admitting: Adult Health

## 2022-05-22 DIAGNOSIS — F988 Other specified behavioral and emotional disorders with onset usually occurring in childhood and adolescence: Secondary | ICD-10-CM

## 2022-05-22 MED ORDER — AMPHETAMINE-DEXTROAMPHETAMINE 10 MG PO TABS: ORAL_TABLET | ORAL | 0 refills | Status: DC

## 2022-05-22 NOTE — Telephone Encounter (Signed)
Wanting to lower the adderall from 20 mg to 10mg , is also requesting a refill on it pease

## 2022-05-22 NOTE — Progress Notes (Signed)
Future Appointments  Date Time Provider Department Center  09/03/2022  9:30 AM [name], NP [facility] None  03/05/2023  3:00 PM Cranford, [name], NP [facility] None    PDMP reviewed for adderall refill request. He requested refill at lower dose, 10 mg rather than previous 20 mg. Provider reviewed and sent as requested.

## 2022-06-18 ENCOUNTER — Telehealth: Payer: Self-pay | Admitting: Nurse Practitioner

## 2022-06-18 ENCOUNTER — Other Ambulatory Visit: Payer: Self-pay | Admitting: Nurse Practitioner

## 2022-06-18 DIAGNOSIS — F419 Anxiety disorder, unspecified: Secondary | ICD-10-CM

## 2022-06-18 DIAGNOSIS — F988 Other specified behavioral and emotional disorders with onset usually occurring in childhood and adolescence: Secondary | ICD-10-CM

## 2022-06-18 MED ORDER — AMPHETAMINE-DEXTROAMPHETAMINE 10 MG PO TABS: ORAL_TABLET | ORAL | 0 refills | Status: DC

## 2022-06-18 MED ORDER — ESCITALOPRAM OXALATE 10 MG PO TABS: 10.0000 mg | ORAL_TABLET | Freq: Every day | ORAL | 3 refills | Status: DC

## 2022-06-18 NOTE — Telephone Encounter (Signed)
Pt requesting refill on Lexapro and Adderall to [facility] [address] [facility] [place]

## 2022-07-16 ENCOUNTER — Telehealth: Payer: Self-pay | Admitting: Nurse Practitioner

## 2022-07-16 NOTE — Telephone Encounter (Signed)
Patient is requesting a refill on [facility] to [facility] [address].

## 2022-07-18 ENCOUNTER — Other Ambulatory Visit: Payer: Self-pay | Admitting: Nurse Practitioner

## 2022-07-18 DIAGNOSIS — F988 Other specified behavioral and emotional disorders with onset usually occurring in childhood and adolescence: Secondary | ICD-10-CM

## 2022-07-18 MED ORDER — AMPHETAMINE-DEXTROAMPHETAMINE 10 MG PO TABS: ORAL_TABLET | ORAL | 0 refills | Status: DC

## 2022-08-13 ENCOUNTER — Telehealth: Payer: Self-pay | Admitting: Nurse Practitioner

## 2022-08-13 NOTE — Telephone Encounter (Signed)
Pt is requesting a refill on Adderall, it is not due until Oct 1 but will be out of town starting Oct 2. [facility] [place]

## 2022-08-14 ENCOUNTER — Other Ambulatory Visit: Payer: Self-pay | Admitting: Nurse Practitioner

## 2022-08-14 DIAGNOSIS — F988 Other specified behavioral and emotional disorders with onset usually occurring in childhood and adolescence: Secondary | ICD-10-CM

## 2022-08-14 MED ORDER — AMPHETAMINE-DEXTROAMPHETAMINE 10 MG PO TABS: ORAL_TABLET | ORAL | 0 refills | Status: DC

## 2022-09-03 ENCOUNTER — Ambulatory Visit: Payer: Self-pay | Admitting: Nurse Practitioner

## 2022-09-03 NOTE — Progress Notes (Deleted)
Assessment and Plan:  Austin [name] was seen today for a ***.  Diagnoses and all order for this visit:  There are no diagnoses linked to this encounter.   Continue to monitor for any increase in fever, chills, N/V, diarrhea, changes to bowel habits, blood in stool.  Notify office for further evaluation and treatment, questions or concerns if s/s fail to improve. The risks and benefits of my recommendations, as well as other treatment options were discussed with the patient today. Questions were answered.  Further disposition pending results of labs. Discussed med's effects and SE's.    Over *** minutes of exam, counseling, chart review, and critical decision making was performed.   Future Appointments  Date Time Provider [place]  09/03/2022  9:30 AM Darrol Jump, NP [facility] None  03/05/2023  3:00 PM Kathalene Sporer, Kenney Houseman, NP [facility] None    ------------------------------------------------------------------------------------------------------------------   HPI There were no vitals taken for this visit. 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 DO NOT DRIVE AFTER TAKING THIS MEDICATION.   amphetamine-dextroamphetamine (ADDERALL) 10 MG tablet Take 1/2-1 tab twice daily as needed for focus; try to limit to <5 days/week.   escitalopram (LEXAPRO) 10 MG tablet Take 1 tablet (10 mg total) by mouth daily.   mirtazapine (REMERON) 15 MG tablet Take 1 tab at bedtime for appetite and weight gain.   Multiple Vitamin (MULTIVITAMIN ADULT PO) Take by mouth. (Patient not taking: Reported on 03/04/2022)   Omega-3 Fatty Acids (FISH OIL PO) Take by mouth.  (Patient not taking: Reported on 03/04/2022)   No current facility-administered medications on file prior to visit.    ROS: all negative except what is noted in the HPI.   Physical Exam:  There were no vitals taken for this visit.  General Appearance: NAD.  Awake, conversant and cooperative. Eyes: PERRLA, EOMs intact.  Sclera white.  Conjunctiva without erythema. Sinuses: No frontal/maxillary tenderness.  No nasal discharge. Nares patent.  ENT/Mouth: Ext aud canals clear.  Bilateral TMs w/DOL and without erythema or bulging. Hearing intact.  Posterior pharynx without swelling or exudate.  Tonsils without swelling or erythema.  Neck: Supple.  No masses, nodules or thyromegaly. Respiratory: Effort is regular with non-labored breathing. Breath sounds are equal bilaterally without rales, rhonchi, wheezing or stridor.  Cardio: RRR with no MRGs. Brisk peripheral pulses without edema.  Abdomen: Active BS in all four quadrants.  Soft and non-tender without guarding, rebound tenderness, hernias or masses. Lymphatics: Non tender without lymphadenopathy.  Musculoskeletal: Full ROM, 5/5 strength, normal ambulation.  No clubbing or cyanosis. Skin: Appropriate color for ethnicity. Warm without rashes, lesions, ecchymosis, ulcers.  Neuro: CN II-XII grossly normal. Normal muscle tone without cerebellar symptoms and intact sensation.   Psych: AO X 3,  appropriate mood and affect, insight and judgment.     Darrol Jump, NP 8:51 AM [facility]

## 2022-09-05 ENCOUNTER — Encounter: Payer: Self-pay | Admitting: Adult Health

## 2022-09-08 ENCOUNTER — Telehealth: Payer: Self-pay | Admitting: Nurse Practitioner

## 2022-09-08 NOTE — Telephone Encounter (Signed)
Pt is requesting a refill [facility] [place] [facility] [address]

## 2022-09-12 ENCOUNTER — Encounter: Payer: Self-pay | Admitting: Nurse Practitioner

## 2022-09-12 ENCOUNTER — Ambulatory Visit (INDEPENDENT_AMBULATORY_CARE_PROVIDER_SITE_OTHER): Payer: Self-pay | Admitting: Nurse Practitioner

## 2022-09-12 DIAGNOSIS — F988 Other specified behavioral and emotional disorders with onset usually occurring in childhood and adolescence: Secondary | ICD-10-CM

## 2022-09-12 MED ORDER — AMPHETAMINE-DEXTROAMPHETAMINE 10 MG PO TABS: ORAL_TABLET | ORAL | 0 refills | Status: DC

## 2022-09-12 NOTE — Progress Notes (Signed)
Follow Up  Assessment and Plan:  Zurich was seen today for a follow up  Diagnoses and all orders for this visit:  Attention deficit disorder, unspecified hyperactivity presence Continue meds; monitor PDMPR closely  Helps with focus, no AE's. The patient was counseled on the addictive nature of the medication and was encouraged to take drug holidays when not needed.  -     amphetamine-dextroamphetamine (ADDERALL XR) 20 MG 24 hr capsule; Take 1 capsule (20 mg total) by mouth daily.  Anxiety Well managed by current regimen; continue medications Stress management techniques discussed, increase water, good sleep hygiene discussed, increase exercise, and increase veggies.  -     escitalopram (LEXAPRO) 10 MG tablet; Take 1 tablet (10 mg total) by mouth daily.   Current every day vaping Discussed risks associated with tobacco use and advised to reduce or quit Patient is ready to do so and plans to continue slow taper Declines Chantix or other medication Strategies discussed; wife is helping with accountability  Will follow up at the next visit  Meds ordered this encounter  Medications   amphetamine-dextroamphetamine (ADDERALL) 10 MG tablet    Sig: Take 1/2-1 tab twice daily as needed for focus; try to limit to <5 days/week.    Dispense:  55 tablet    Refill:  0    Order Specific Question:   Supervising Provider    Answer:   Unk Pinto [phone]    Discussed med's effects and SE's. Screening labs and tests as requested with regular follow-up as recommended.  Over 20 minutes of exam, counseling, chart review and critical decision making was performed  Future Appointments  Date Time Provider [place]  03/05/2023  3:00 PM Darrol Jump, NP [facility] None     HPI 31 y.o. male patient presents for a follow up and medication refill. She has ADD (attention deficit disorder); Anxiety; Body mass index (BMI) less than 16.5; Protein-calorie malnutrition, severe; Vitamin D  deficiency; Current every day vaping; and Severely underweight adult on their problem list.   Overall he reports doing well.  He has no new or additional concerns at this time.    He quit smoking but has been vaping; working to taper down.   He has long hx of ADD, was prescribed adderall 30 mg XR by psych office but lost insurance, currently on adderall IR 20 mg in AM on work days, if busy will take an additional 1/2 tab   BMI is Body mass index is 16.04 kg/m., he has been working on diet and exercise.  Wt Readings from Last 3 Encounters:  09/12/22 115 lb (52.2 kg)  03/04/22 113 lb 12.8 oz (51.6 kg)  09/05/21 118 lb (53.5 kg)   Today their BP is BP: 110/74 He does workout. He denies chest pain, shortness of breath, dizziness.   Current Medications:  Current Outpatient Medications on File Prior to Visit  Medication Sig Dispense Refill   ALPRAZolam (XANAX) 1 MG tablet TAKE 1/2-1 TAB PRIOR TO BLOOD DRAWS OR PROCEDURE AS NEEDED FOR ANXIETY/PANIC ATTACKS. DO NOT DRIVE AFTER TAKING THIS MEDICATION. 2 tablet 0   amphetamine-dextroamphetamine (ADDERALL) 10 MG tablet Take 1/2-1 tab twice daily as needed for focus; try to limit to <5 days/week. 55 tablet 0   escitalopram (LEXAPRO) 10 MG tablet Take 1 tablet (10 mg total) by mouth daily. 90 tablet 3   mirtazapine (REMERON) 15 MG tablet Take 1 tab at bedtime for appetite and weight gain. 30 tablet 2   Multiple Vitamin (MULTIVITAMIN  ADULT PO) Take by mouth.     Omega-3 Fatty Acids (FISH OIL PO) Take by mouth. (Patient not taking: Reported on 03/04/2022)     No current facility-administered medications on file prior to visit.   Allergies:  No Known Allergies Health Maintenance:  Immunization History  Administered Date(s) Administered   Tdap 01/15/2021   Health Maintenance  Topic Date Due   COVID-19 Vaccine (1) Never done   INFLUENZA VACCINE  Never done   TETANUS/TDAP  01/16/2031   Hepatitis C Screening  Completed   HIV Screening   Completed   HPV VACCINES  Aged Out   Patient Care Team: Lucky Cowboy, MD as PCP - General (Internal Medicine) Karie Soda, MD as Consulting Physician (General Surgery) [name], NP as Nurse Practitioner (Nurse Practitioner)  Medical History:  has ADD (attention deficit disorder); Anxiety; Body mass index (BMI) less than 16.5; Protein-calorie malnutrition, severe; Vitamin D deficiency; Current every day vaping; and Severely underweight adult on their problem list. Surgical History:  He  has a past surgical history that includes Mouth surgery; Fetal surgery for congenital hernia; laparotomy (Right, 06/07/2019); Partial colectomy (Right, 06/07/2019); and Hernia repair (Bilateral, 2019). Family History:  His family history includes Alcohol abuse in his maternal grandmother; Anxiety disorder in his brother; Blindness in his maternal grandmother; Deep vein thrombosis in his maternal grandmother; Diabetes in his father; Hypertension in his father; Lung cancer in his paternal grandmother. Social History:   reports that he quit smoking about 8 years ago. His smoking use included e-cigarettes and cigarettes. He started smoking about 13 years ago. He has a 3.75 pack-year smoking history. He has never used smokeless tobacco. He reports that he does not drink alcohol and does not use drugs.  Review of Systems:  Review of Systems  Constitutional:  Negative for malaise/fatigue and weight loss.  HENT:  Negative for hearing loss and tinnitus.   Eyes:  Negative for blurred vision and double vision.  Respiratory:  Negative for cough, sputum production, shortness of breath and wheezing.   Cardiovascular:  Negative for chest pain, palpitations, orthopnea, claudication, leg swelling and PND.  Gastrointestinal:  Negative for abdominal pain, blood in stool, constipation, diarrhea, heartburn, melena, nausea and vomiting.  Genitourinary: Negative.   Musculoskeletal:  Negative for falls, joint pain and  myalgias.  Skin:  Negative for rash.  Neurological:  Negative for dizziness, tingling, sensory change, weakness and headaches.  Endo/Heme/Allergies:  Negative for polydipsia.  Psychiatric/Behavioral:  Negative for depression, memory loss, substance abuse and suicidal ideas. The patient is nervous/anxious (med procedures/blood draws). The patient does not have insomnia.   All other systems reviewed and are negative.   Physical Exam: Estimated body mass index is 16.04 kg/m as calculated from the following:   Height as of this encounter: 5\' 11"  (1.803 m).   Weight as of this encounter: 115 lb (52.2 kg). BP 110/74   Pulse (!) 102   Temp 98 F (36.7 C)   Ht 5\' 11"  (1.803 m)   Wt 115 lb (52.2 kg)   SpO2 98%   BMI 16.04 kg/m  General Appearance: Very thin adult male, well dressed, good hygiene, in no apparent distress.  Eyes: PERRLA, EOMs, conjunctiva no swelling or erythema Sinuses: No Frontal/maxillary tenderness  ENT/Mouth: Ext aud canals clear, normal light reflex with TMs without erythema, bulging. Good dentition. No erythema, swelling, or exudate on post pharynx. Tonsils not swollen or erythematous. Hearing normal.  Neck: Supple, thyroid normal. No bruits  Respiratory: Respiratory effort normal,  BS equal bilaterally without rales, rhonchi, wheezing or stridor.  Cardio: RRR without murmurs, rubs or gallops. Brisk peripheral pulses without edema.  Chest: symmetric, with normal excursions and percussion.  Abdomen: Soft, nontender, no guarding, rebound, hernias, masses, or organomegaly. Well healed midline surgical scar.  Lymphatics: Non tender without lymphadenopathy.  Genitourinary: declines, no concerns on self checks Musculoskeletal: Full ROM all peripheral extremities,5/5 strength, and normal gait.  Skin: Warm, dry without rashes, lesions, ecchymosis. Neuro: Cranial nerves intact, reflexes equal bilaterally. Normal muscle tone, no cerebellar symptoms. Sensation intact.  Psych:  Awake and oriented X 3, normal affect, Insight and Judgment appropriate.    [name], NP-C 11:54 AM [facility]

## 2022-09-23 ENCOUNTER — Encounter: Payer: Self-pay | Admitting: Nurse Practitioner

## 2022-09-23 ENCOUNTER — Other Ambulatory Visit: Payer: Self-pay | Admitting: Nurse Practitioner

## 2022-09-23 DIAGNOSIS — F988 Other specified behavioral and emotional disorders with onset usually occurring in childhood and adolescence: Secondary | ICD-10-CM

## 2022-10-08 MED ORDER — AMPHETAMINE-DEXTROAMPHETAMINE 10 MG PO TABS: ORAL_TABLET | ORAL | 0 refills | Status: DC

## 2022-10-08 NOTE — Telephone Encounter (Signed)
Pt called this morning wanting to make sure [name] saw his message before we closed

## 2022-11-03 ENCOUNTER — Encounter: Payer: Self-pay | Admitting: Nurse Practitioner

## 2022-11-03 DIAGNOSIS — F988 Other specified behavioral and emotional disorders with onset usually occurring in childhood and adolescence: Secondary | ICD-10-CM

## 2022-11-03 MED ORDER — AMPHETAMINE-DEXTROAMPHET ER 20 MG PO CP24: 20.0000 mg | ORAL_CAPSULE | Freq: Every day | ORAL | 0 refills | Status: DC

## 2022-12-02 MED ORDER — AMPHETAMINE-DEXTROAMPHET ER 20 MG PO CP24: 20.0000 mg | ORAL_CAPSULE | Freq: Every day | ORAL | 0 refills | Status: DC

## 2022-12-29 MED ORDER — AMPHETAMINE-DEXTROAMPHET ER 20 MG PO CP24: 20.0000 mg | ORAL_CAPSULE | Freq: Every day | ORAL | 0 refills | Status: DC

## 2023-01-30 ENCOUNTER — Other Ambulatory Visit: Payer: Self-pay | Admitting: Nurse Practitioner

## 2023-01-30 DIAGNOSIS — F988 Other specified behavioral and emotional disorders with onset usually occurring in childhood and adolescence: Secondary | ICD-10-CM

## 2023-01-30 MED ORDER — AMPHETAMINE-DEXTROAMPHET ER 20 MG PO CP24: 20.0000 mg | ORAL_CAPSULE | Freq: Every day | ORAL | 0 refills | Status: DC

## 2023-02-20 ENCOUNTER — Encounter: Payer: Self-pay | Admitting: Nurse Practitioner

## 2023-02-20 DIAGNOSIS — F410 Panic disorder [episodic paroxysmal anxiety] without agoraphobia: Secondary | ICD-10-CM

## 2023-02-22 MED ORDER — ALPRAZOLAM 1 MG PO TABS: ORAL_TABLET | ORAL | 0 refills | Status: DC

## 2023-02-26 ENCOUNTER — Other Ambulatory Visit: Payer: Self-pay | Admitting: Nurse Practitioner

## 2023-02-26 DIAGNOSIS — F988 Other specified behavioral and emotional disorders with onset usually occurring in childhood and adolescence: Secondary | ICD-10-CM

## 2023-02-27 ENCOUNTER — Encounter: Payer: Self-pay | Admitting: Nurse Practitioner

## 2023-02-27 DIAGNOSIS — F988 Other specified behavioral and emotional disorders with onset usually occurring in childhood and adolescence: Secondary | ICD-10-CM

## 2023-02-27 MED ORDER — AMPHETAMINE-DEXTROAMPHET ER 20 MG PO CP24: 20.0000 mg | ORAL_CAPSULE | Freq: Every day | ORAL | 0 refills | Status: DC

## 2023-03-05 ENCOUNTER — Encounter: Payer: Self-pay | Admitting: Nurse Practitioner

## 2023-03-05 NOTE — Progress Notes (Deleted)
Complete Physical  Assessment and Plan:  Austin [name] was seen today for annual exam.  Diagnoses and all orders for this visit:  Encounter for routine adult health examination with abnormal findings Due annually  Health Maintenance reviewed Healthy lifestyle reviewed and goals set - he will reach out to job about signing up for health insurance   Attention deficit disorder, unspecified hyperactivity presence Continue meds; monitor PDMPR closely  Helps with focus, no AE's. The patient was counseled on the addictive nature of the medication and was encouraged to take drug holidays when not needed.  -     amphetamine-dextroamphetamine (ADDERALL XR) 20 MG 24 hr capsule; Take 1 capsule (20 mg total) by mouth daily.  Anxiety Well managed by current regimen; continue medications Stress management techniques discussed, increase water, good sleep hygiene discussed, increase exercise, and increase veggies.  -     escitalopram (LEXAPRO) 10 MG tablet; Take 1 tablet (10 mg total) by mouth daily.  BMI less than 19,adult (BMI 15.87)/ protein calorie malnutrition Father with similar body habitus; patient reports low body weight his whole life but has been losing weight since job change Has had normal thyroid checks Long discussion about weight, diet, and exercise Recommended diet heavy in fruits and veggies and low in animal meats, cheeses, and dairy products, appropriate calorie intake Aim for caloric surplus, add resistance exercises Discussed appropriate weight for height Will try adding remeron in the evening after disucssion, follow up with progress in 1 month, goal 0.5-2 lb/week   Vitamin D deficiency Continue supplement; defer checking due to cash pay/cost  Current every day vaping Discussed risks associated with tobacco use and advised to reduce or quit Patient is ready to do so and plans to continue slow taper Declines Chantix or other medication Strategies discussed; wife is helping with  accountability  Will follow up at the next visit  Screening for thyroid disorder TSH  No orders of the defined types were placed in this encounter.   Discussed med's effects and SE's. Screening labs and tests as requested with regular follow-up as recommended. Over 40 minutes of exam, counseling, chart review and critical decision making was performed  Future Appointments  Date Time Provider Department Center  03/05/2023  3:00 PM [name], NP [facility] None  03/07/2024  3:00 PM Sarie Stall, [name], NP [facility] None     HPI 32 y.o. male patient presents for a complete physical. She has ADD (attention deficit disorder); Anxiety; Body mass index (BMI) less than 16.5; Protein-calorie malnutrition, severe; Vitamin D deficiency; Current every day vaping; and Severely underweight adult on their problem list.   He has fiance, she is Autistic, has 79 y/o daughter. He has new job but Information systems manager., open ended contract work.   06/07/19 volvulus which resulted in bowel resection. He had prior double hernia repair.  Has done well since, takes miralax as needed.    He reports chronic anxiety following smoking cessation/chantix several years ago; much improved with lexapro. He does have xanax to use prior to blood draws.   He quit smoking but has been vaping; working to taper down.   He has long hx of ADD, was prescribed adderall 30 mg XR by psych office but lost insurance, currently on adderall IR 20 mg in AM on work days, if busy will take an additional 1/2 tab   BMI is There is no height or weight on file to calculate BMI., he has been working on diet and exercise. Was trying to eat less processed,  eats 3 full meals most days, has always been similar weight, wants to start working on to gain muscle. Strategies discussed - needs caloric surplus. Has had normal TSH.  Wt Readings from Last 3 Encounters:  09/12/22 115 lb (52.2 kg)  03/04/22 113 lb 12.8 oz (51.6 kg)  09/05/21 118 lb (53.5  kg)   Today their BP is   He does workout. He denies chest pain, shortness of breath, dizziness.   The cholesterol last visit was:   Lab Results  Component Value Date   CHOL 133 07/06/2014   HDL 51 07/06/2014   LDLCALC 57 07/06/2014   TRIG 125 07/06/2014   CHOLHDL 2.6 07/06/2014   He has been working on diet and exercise for glucose management. Denies diabetic polys. Dad was diagnosed with T2DM later in life. Last A1C in the office was:  Lab Results  Component Value Date   HGBA1C 4.9 07/06/2014   Last GFR: Lab Results  Component Value Date   GFRNONAA 91 08/11/2019   Patient is not currently on Vitamin D supplement.   Lab Results  Component Value Date   VD25OH 26 (L) 08/11/2019       Current Medications:  Current Outpatient Medications on File Prior to Visit  Medication Sig Dispense Refill   ALPRAZolam (XANAX) 1 MG tablet TAKE 1/2-1 TAB PRIOR TO BLOOD DRAWS OR PROCEDURE AS NEEDED FOR ANXIETY/PANIC ATTACKS. DO NOT DRIVE AFTER TAKING THIS MEDICATION. 2 tablet 0   amphetamine-dextroamphetamine (ADDERALL XR) 20 MG 24 hr capsule Take 1 capsule (20 mg total) by mouth daily. 30 capsule 0   escitalopram (LEXAPRO) 10 MG tablet Take 1 tablet (10 mg total) by mouth daily. 90 tablet 3   mirtazapine (REMERON) 15 MG tablet Take 1 tab at bedtime for appetite and weight gain. 30 tablet 2   Multiple Vitamin (MULTIVITAMIN ADULT PO) Take by mouth.     Omega-3 Fatty Acids (FISH OIL PO) Take by mouth. (Patient not taking: Reported on 03/04/2022)     No current facility-administered medications on file prior to visit.   Allergies:  No Known Allergies Health Maintenance:  Immunization History  Administered Date(s) Administered   Tdap 01/15/2021   Health Maintenance  Topic Date Due   COVID-19 Vaccine (1) Never done   INFLUENZA VACCINE  06/18/2023   DTaP/Tdap/Td (2 - Td or Tdap) 01/16/2031   Hepatitis C Screening  Completed   HIV Screening  Completed   HPV VACCINES  Aged Out    Tetanus: 2022 Covid 19: declines   Colonoscopy: - due age 40  Eye Exam: last 2022, has glasses Dentist: Dr. Arman Filter, last 2022, goes q59m  Patient Care Team: Lucky Cowboy, MD as PCP - General (Internal Medicine) Karie Soda, MD as Consulting Physician (General Surgery) [name], NP as Nurse Practitioner (Nurse Practitioner)  Medical History:  has ADD (attention deficit disorder); Anxiety; Body mass index (BMI) less than 16.5; Protein-calorie malnutrition, severe; Vitamin D deficiency; Current every day vaping; and Severely underweight adult on their problem list. Surgical History:  He  has a past surgical history that includes Mouth surgery; Fetal surgery for congenital hernia; laparotomy (Right, 06/07/2019); Partial colectomy (Right, 06/07/2019); and Hernia repair (Bilateral, 2019). Family History:  His family history includes Alcohol abuse in his maternal grandmother; Anxiety disorder in his brother; Blindness in his maternal grandmother; Deep vein thrombosis in his maternal grandmother; Diabetes in his father; Hypertension in his father; Lung cancer in his paternal grandmother. Social History:   reports that he quit  smoking about 9 years ago. His smoking use included e-cigarettes and cigarettes. He started smoking about 14 years ago. He has a 3.75 pack-year smoking history. He has never used smokeless tobacco. He reports that he does not drink alcohol and does not use drugs.  Review of Systems:  Review of Systems  Constitutional:  Negative for malaise/fatigue and weight loss.  HENT:  Negative for hearing loss and tinnitus.   Eyes:  Negative for blurred vision and double vision.  Respiratory:  Negative for cough, sputum production, shortness of breath and wheezing.   Cardiovascular:  Negative for chest pain, palpitations, orthopnea, claudication, leg swelling and PND.  Gastrointestinal:  Negative for abdominal pain, blood in stool, constipation, diarrhea, heartburn,  melena, nausea and vomiting.  Genitourinary: Negative.   Musculoskeletal:  Negative for falls, joint pain and myalgias.  Skin:  Negative for rash.  Neurological:  Negative for dizziness, tingling, sensory change, weakness and headaches.  Endo/Heme/Allergies:  Negative for polydipsia.  Psychiatric/Behavioral:  Negative for depression, memory loss, substance abuse and suicidal ideas. The patient is nervous/anxious (med procedures/blood draws). The patient does not have insomnia.   All other systems reviewed and are negative.   Physical Exam: Estimated body mass index is 16.04 kg/m as calculated from the following:   Height as of 09/12/22: 5\' 11"  (1.803 m).   Weight as of 09/12/22: 115 lb (52.2 kg). There were no vitals taken for this visit. General Appearance: Very thin adult male, well dressed, good hygiene, in no apparent distress.  Eyes: PERRLA, EOMs, conjunctiva no swelling or erythema Sinuses: No Frontal/maxillary tenderness  ENT/Mouth: Ext aud canals clear, normal light reflex with TMs without erythema, bulging. Good dentition. No erythema, swelling, or exudate on post pharynx. Tonsils not swollen or erythematous. Hearing normal.  Neck: Supple, thyroid normal. No bruits  Respiratory: Respiratory effort normal, BS equal bilaterally without rales, rhonchi, wheezing or stridor.  Cardio: RRR without murmurs, rubs or gallops. Brisk peripheral pulses without edema.  Chest: symmetric, with normal excursions and percussion.  Abdomen: Soft, nontender, no guarding, rebound, hernias, masses, or organomegaly. Well healed midline surgical scar.  Lymphatics: Non tender without lymphadenopathy.  Genitourinary: declines, no concerns on self checks Musculoskeletal: Full ROM all peripheral extremities,5/5 strength, and normal gait.  Skin: Warm, dry without rashes, lesions, ecchymosis. Neuro: Cranial nerves intact, reflexes equal bilaterally. Normal muscle tone, no cerebellar symptoms. Sensation  intact.  Psych: Awake and oriented X 3, normal affect, Insight and Judgment appropriate.   EKG: Defer  [name], NP-C 1:13 PM [facility]

## 2023-03-30 ENCOUNTER — Other Ambulatory Visit: Payer: Self-pay | Admitting: Nurse Practitioner

## 2023-03-30 ENCOUNTER — Encounter: Payer: Self-pay | Admitting: Nurse Practitioner

## 2023-03-30 DIAGNOSIS — F988 Other specified behavioral and emotional disorders with onset usually occurring in childhood and adolescence: Secondary | ICD-10-CM

## 2023-03-30 MED ORDER — AMPHETAMINE-DEXTROAMPHET ER 20 MG PO CP24
20.0000 mg | ORAL_CAPSULE | Freq: Every day | ORAL | 0 refills | Status: DC
Start: 2023-03-30 — End: 2023-04-27

## 2023-03-30 NOTE — Progress Notes (Deleted)
Complete Physical  Assessment and Plan:  Austin [name] was seen today for annual exam.  Diagnoses and all orders for this visit:  Encounter for routine adult health examination with abnormal findings Due annually  Health Maintenance reviewed Healthy lifestyle reviewed and goals set - he will reach out to job about signing up for health insurance   Attention deficit disorder, unspecified hyperactivity presence Continue meds; monitor PDMPR closely  Helps with focus, no AE's. The patient was counseled on the addictive nature of the medication and was encouraged to take drug holidays when not needed.  -     amphetamine-dextroamphetamine (ADDERALL XR) 20 MG 24 hr capsule; Take 1 capsule (20 mg total) by mouth daily.  Anxiety Well managed by current regimen; continue medications Stress management techniques discussed, increase water, good sleep hygiene discussed, increase exercise, and increase veggies.  -     escitalopram (LEXAPRO) 10 MG tablet; Take 1 tablet (10 mg total) by mouth daily.  BMI less than 19,adult (BMI 15.87)/ protein calorie malnutrition Father with similar body habitus; patient reports low body weight his whole life but has been losing weight since job change Has had normal thyroid checks Long discussion about weight, diet, and exercise Recommended diet heavy in fruits and veggies and low in animal meats, cheeses, and dairy products, appropriate calorie intake Aim for caloric surplus, add resistance exercises Discussed appropriate weight for height Will try adding remeron in the evening after disucssion, follow up with progress in 1 month, goal 0.5-2 lb/week   Vitamin D deficiency Continue supplement; defer checking due to cash pay/cost  Current every day vaping Discussed risks associated with tobacco use and advised to reduce or quit Patient is ready to do so and plans to continue slow taper Declines Chantix or other medication Strategies discussed; wife is helping with  accountability  Will follow up at the next visit  Screening for thyroid disorder TSH  Screening for cholesterol Discussed lifestyle modifications. Recommended diet heavy in fruits and veggies, omega 3's. Decrease consumption of animal meats, cheeses, and dairy products. Remain active and exercise as tolerated. Continue to monitor. Check lipids/TSH  Screening for blood or protein in urine Check and monitor Urinalysis/Microalbumin Routine w reflex microscopic  B12 Deficiency Monitor levels      Discussed med's effects and SE's. Screening labs and tests as requested with regular follow-up as recommended. Over 40 minutes of exam, counseling, chart review and critical decision making was performed  Future Appointments  Date Time Provider Department Center  03/30/2023  9:00 AM [name], NP [facility] None     HPI 32 y.o. male patient presents for a complete physical. She has ADD (attention deficit disorder); Anxiety; Body mass index (BMI) less than 16.5; Protein-calorie malnutrition, severe; Vitamin D deficiency; Current every day vaping; and Severely underweight adult on their problem list.   He has fiance, she is Autistic, has 58 y/o daughter. He has new job but Information systems manager., open ended contract work.   06/07/19 volvulus which resulted in bowel resection. He had prior double hernia repair.  Has done well since, takes miralax as needed.    He reports chronic anxiety following smoking cessation/chantix several years ago; much improved with lexapro. He does have xanax to use prior to blood draws.   He quit smoking but has been vaping; working to taper down.   He has long hx of ADD, was prescribed adderall 30 mg XR by psych office but lost insurance, currently on adderall IR 20 mg in AM on work  days, if busy will take an additional 1/2 tab   BMI is There is no height or weight on file to calculate BMI., he has been working on diet and exercise. Was trying to eat less  processed, eats 3 full meals most days, has always been similar weight, wants to start working on to gain muscle. Strategies discussed - needs caloric surplus. Has had normal TSH.  Wt Readings from Last 3 Encounters:  09/12/22 115 lb (52.2 kg)  03/04/22 113 lb 12.8 oz (51.6 kg)  09/05/21 118 lb (53.5 kg)   Today their BP is   He does workout. He denies chest pain, shortness of breath, dizziness.   The cholesterol last visit was:   Lab Results  Component Value Date   CHOL 133 07/06/2014   HDL 51 07/06/2014   LDLCALC 57 07/06/2014   TRIG 125 07/06/2014   CHOLHDL 2.6 07/06/2014   He has been working on diet and exercise for glucose management. Denies diabetic polys. Dad was diagnosed with T2DM later in life. Last A1C in the office was:  Lab Results  Component Value Date   HGBA1C 4.9 07/06/2014   Last GFR: Lab Results  Component Value Date   GFRNONAA 91 08/11/2019   Patient is not currently on Vitamin D supplement.   Lab Results  Component Value Date   VD25OH 26 (L) 08/11/2019       Current Medications:  Current Outpatient Medications on File Prior to Visit  Medication Sig Dispense Refill   ALPRAZolam (XANAX) 1 MG tablet TAKE 1/2-1 TAB PRIOR TO BLOOD DRAWS OR PROCEDURE AS NEEDED FOR ANXIETY/PANIC ATTACKS. DO NOT DRIVE AFTER TAKING THIS MEDICATION. 2 tablet 0   amphetamine-dextroamphetamine (ADDERALL XR) 20 MG 24 hr capsule Take 1 capsule (20 mg total) by mouth daily. 30 capsule 0   escitalopram (LEXAPRO) 10 MG tablet Take 1 tablet (10 mg total) by mouth daily. 90 tablet 3   mirtazapine (REMERON) 15 MG tablet Take 1 tab at bedtime for appetite and weight gain. 30 tablet 2   Multiple Vitamin (MULTIVITAMIN ADULT PO) Take by mouth.     Omega-3 Fatty Acids (FISH OIL PO) Take by mouth. (Patient not taking: Reported on 03/04/2022)     No current facility-administered medications on file prior to visit.   Allergies:  No Known Allergies Health Maintenance:  Immunization History   Administered Date(s) Administered   Tdap 01/15/2021   Health Maintenance  Topic Date Due   COVID-19 Vaccine (1) Never done   INFLUENZA VACCINE  06/18/2023   DTaP/Tdap/Td (2 - Td or Tdap) 01/16/2031   Hepatitis C Screening  Completed   HIV Screening  Completed   HPV VACCINES  Aged Out   Tetanus: 2022 Covid 19: declines   Colonoscopy: - due age 61  Eye Exam: last 2022, has glasses Dentist: Dr. Arman Filter, last 2022, goes q65m  Patient Care Team: Lucky Cowboy, MD as PCP - General (Internal Medicine) Karie Soda, MD as Consulting Physician (General Surgery) [name], NP as Nurse Practitioner (Nurse Practitioner)  Medical History:  has ADD (attention deficit disorder); Anxiety; Body mass index (BMI) less than 16.5; Protein-calorie malnutrition, severe; Vitamin D deficiency; Current every day vaping; and Severely underweight adult on their problem list. Surgical History:  He  has a past surgical history that includes Mouth surgery; Fetal surgery for congenital hernia; laparotomy (Right, 06/07/2019); Partial colectomy (Right, 06/07/2019); and Hernia repair (Bilateral, 2019). Family History:  His family history includes Alcohol abuse in his maternal grandmother; Anxiety  disorder in his brother; Blindness in his maternal grandmother; Deep vein thrombosis in his maternal grandmother; Diabetes in his father; Hypertension in his father; Lung cancer in his paternal grandmother. Social History:   reports that he quit smoking about 9 years ago. His smoking use included e-cigarettes and cigarettes. He started smoking about 14 years ago. He has a 3.75 pack-year smoking history. He has never used smokeless tobacco. He reports that he does not drink alcohol and does not use drugs.  Review of Systems:  Review of Systems  Constitutional:  Negative for malaise/fatigue and weight loss.  HENT:  Negative for hearing loss and tinnitus.   Eyes:  Negative for blurred vision and double  vision.  Respiratory:  Negative for cough, sputum production, shortness of breath and wheezing.   Cardiovascular:  Negative for chest pain, palpitations, orthopnea, claudication, leg swelling and PND.  Gastrointestinal:  Negative for abdominal pain, blood in stool, constipation, diarrhea, heartburn, melena, nausea and vomiting.  Genitourinary: Negative.   Musculoskeletal:  Negative for falls, joint pain and myalgias.  Skin:  Negative for rash.  Neurological:  Negative for dizziness, tingling, sensory change, weakness and headaches.  Endo/Heme/Allergies:  Negative for polydipsia.  Psychiatric/Behavioral:  Negative for depression, memory loss, substance abuse and suicidal ideas. The patient is nervous/anxious (med procedures/blood draws). The patient does not have insomnia.   All other systems reviewed and are negative.   Physical Exam: Estimated body mass index is 16.04 kg/m as calculated from the following:   Height as of 09/12/22: 5\' 11"  (1.803 m).   Weight as of 09/12/22: 115 lb (52.2 kg). There were no vitals taken for this visit. General Appearance: Very thin adult male, well dressed, good hygiene, in no apparent distress.  Eyes: PERRLA, EOMs, conjunctiva no swelling or erythema Sinuses: No Frontal/maxillary tenderness  ENT/Mouth: Ext aud canals clear, normal light reflex with TMs without erythema, bulging. Good dentition. No erythema, swelling, or exudate on post pharynx. Tonsils not swollen or erythematous. Hearing normal.  Neck: Supple, thyroid normal. No bruits  Respiratory: Respiratory effort normal, BS equal bilaterally without rales, rhonchi, wheezing or stridor.  Cardio: RRR without murmurs, rubs or gallops. Brisk peripheral pulses without edema.  Chest: symmetric, with normal excursions and percussion.  Abdomen: Soft, nontender, no guarding, rebound, hernias, masses, or organomegaly. Well healed midline surgical scar.  Lymphatics: Non tender without lymphadenopathy.   Genitourinary: declines, no concerns on self checks Musculoskeletal: Full ROM all peripheral extremities,5/5 strength, and normal gait.  Skin: Warm, dry without rashes, lesions, ecchymosis. Neuro: Cranial nerves intact, reflexes equal bilaterally. Normal muscle tone, no cerebellar symptoms. Sensation intact.  Psych: Awake and oriented X 3, normal affect, Insight and Judgment appropriate.   EKG: Defer  [name], NP-C 8:49 AM [facility]

## 2023-04-14 ENCOUNTER — Encounter: Payer: Self-pay | Admitting: Nurse Practitioner

## 2023-04-14 ENCOUNTER — Ambulatory Visit: Payer: Self-pay | Admitting: Nurse Practitioner

## 2023-04-14 VITALS — BP 102/72 | HR 100 | Temp 97.8°F | Ht 71.0 in | Wt 108.8 lb

## 2023-04-14 DIAGNOSIS — Z79899 Other long term (current) drug therapy: Secondary | ICD-10-CM

## 2023-04-14 DIAGNOSIS — F988 Other specified behavioral and emotional disorders with onset usually occurring in childhood and adolescence: Secondary | ICD-10-CM

## 2023-04-14 DIAGNOSIS — E538 Deficiency of other specified B group vitamins: Secondary | ICD-10-CM

## 2023-04-14 DIAGNOSIS — Z7289 Other problems related to lifestyle: Secondary | ICD-10-CM

## 2023-04-14 DIAGNOSIS — E559 Vitamin D deficiency, unspecified: Secondary | ICD-10-CM

## 2023-04-14 DIAGNOSIS — F419 Anxiety disorder, unspecified: Secondary | ICD-10-CM

## 2023-04-14 DIAGNOSIS — Z1389 Encounter for screening for other disorder: Secondary | ICD-10-CM

## 2023-04-14 DIAGNOSIS — Z1329 Encounter for screening for other suspected endocrine disorder: Secondary | ICD-10-CM

## 2023-04-14 DIAGNOSIS — Z0001 Encounter for general adult medical examination with abnormal findings: Secondary | ICD-10-CM

## 2023-04-14 DIAGNOSIS — Z681 Body mass index (BMI) 19 or less, adult: Secondary | ICD-10-CM

## 2023-04-14 DIAGNOSIS — Z13220 Encounter for screening for lipoid disorders: Secondary | ICD-10-CM

## 2023-04-14 NOTE — Progress Notes (Signed)
Complete Physical  Assessment and Plan:  Austin [name] was seen today for annual exam.  Diagnoses and all orders for this visit:  Encounter for routine adult health examination with abnormal findings Due annually  Health Maintenance reviewed Healthy lifestyle reviewed and goals set  Attention deficit disorder, unspecified hyperactivity presence Continue meds; monitor PDMPR closely  Helps with focus, no AE's. The patient was counseled on the addictive nature of the medication and was encouraged to take drug holidays when not needed.   Anxiety Continue Escitalopram.   Reviewed relaxation techniques.  Sleep hygiene. Recommended Cognitive Behavioral Therapy (CBT). Recommended mindfulness meditation and exercise.   Insight-oriented psychotherapy given for 16 minutes exclusively. Psychoeducation:  encouraged personality growth wand development through coping techniques and problem-solving skills. Limit/Decrease/Monitor drug/alcohol intake.    BMI less than 19,adult (BMI 15.87)/ protein calorie malnutrition Has had normal thyroid checks Long discussion about weight, diet, and exercise - suggest daily protein supplement. Recommended diet heavy in fruits and veggies and low in animal meats, cheeses, and dairy products, appropriate calorie intake Aim for caloric surplus, add resistance exercises Discussed appropriate weight for height Continue remeron in the evening after discussion  Vitamin D deficiency Continue supplement; defer checking due to cash pay/cost  Current every day vaping Discussed risks associated with tobacco use and advised to reduce or quit Declines Chantix or other medication Smoking cessation instruction/counseling given:  counseled patient on the dangers of tobacco use, advised patient to stop smoking, and reviewed strategies to maximize success  Screening for thyroid disorder TSH  Screening for cholesterol Discussed lifestyle modifications. Recommended diet heavy  in fruits and veggies, omega 3's. Decrease consumption of animal meats, cheeses, and dairy products. Remain active and exercise as tolerated. Continue to monitor. Check lipids/TSH  Screening for blood or protein in urine Check and monitor Urinalysis/Microalbumin Routine w reflex microscopic  B12 Deficiency Monitor levels  Patient is currently uninsured for another 60 Days.  He is to make a f/u apt in 30 days for updated lab work and weight check.     Notify office for further evaluation and treatment, questions or concerns if any reported s/s fail to improve.   The patient was advised to call back or seek an in-person evaluation if any symptoms worsen or if the condition fails to improve as anticipated.   Further disposition pending results of labs. Discussed med's effects and SE's.    I discussed the assessment and treatment plan with the patient. The patient was provided an opportunity to ask questions and all were answered. The patient agreed with the plan and demonstrated an understanding of the instructions.  Discussed med's effects and SE's. Screening labs and tests as requested with regular follow-up as recommended.  I provided 35 minutes of face-to-face time during this encounter including counseling, chart review, and critical decision making was preformed.  Today's Plan of Care is based on a patient-centered health care approach known as shared decision making - the decisions, tests and treatments allow for patient preferences and values to be balanced with clinical evidence.     HPI 32 y.o. male patient presents for a complete physical. She has ADD (attention deficit disorder); Anxiety; Body mass index (BMI) less than 16.5; Protein-calorie malnutrition, severe; Vitamin D deficiency; Current every day vaping; and Severely underweight adult on their problem list.   Overall he reports feeling well today.  He is  married, she is Autistic, has 61 y/o daughter.   Shares that  he is currently working night shift and attending  school for [employer].  This leaves him very stressed, feels as though it  may be a contributor to weight. He has new job but Information systems manager.  06/07/19 volvulus which resulted in bowel resection. He had prior double hernia repair.  Has done well since, takes miralax daily which helps him have a daily BM Bristol Stool Type 4.  Reports this may also contribute to his weight loss.   He reports chronic anxiety following smoking cessation/chantix several years ago; much improved with lexapro. He does have xanax to use prior to blood draws.   He quit smoking but has been vaping; working to taper down.   He has long hx of ADD, was prescribed adderall 30 mg XR by psych office but lost insurance, currently on adderall 20 XR mg in AM on work days, if busy will take an additional 1/2 tab.  Does not feel as though this is always effective.    BMI is Body mass index is 15.17 kg/m., Has had normal TSH. Father with similar body habitus; patient reports low body weight his whole life - weight continues to fluctuate. Wt Readings from Last 3 Encounters:  04/14/23 108 lb 12.8 oz (49.4 kg)  09/12/22 115 lb (52.2 kg)  03/04/22 113 lb 12.8 oz (51.6 kg)   Today their BP is BP: 102/72 He does workout. He denies chest pain, shortness of breath, dizziness.   The cholesterol last visit was:   Lab Results  Component Value Date   CHOL 133 07/06/2014   HDL 51 07/06/2014   LDLCALC 57 07/06/2014   TRIG 125 07/06/2014   CHOLHDL 2.6 07/06/2014   He has been working on diet and exercise for glucose management. Denies diabetic polys. Dad was diagnosed with T2DM later in life. Last A1C in the office was:  Lab Results  Component Value Date   HGBA1C 4.9 07/06/2014   Last GFR: Lab Results  Component Value Date   GFRNONAA 91 08/11/2019   Patient is not currently on Vitamin D supplement.   Lab Results  Component Value Date   VD25OH 26 (L) 08/11/2019       Current  Medications:  Current Outpatient Medications on File Prior to Visit  Medication Sig Dispense Refill   ALPRAZolam (XANAX) 1 MG tablet TAKE 1/2-1 TAB PRIOR TO BLOOD DRAWS OR PROCEDURE AS NEEDED FOR ANXIETY/PANIC ATTACKS. DO NOT DRIVE AFTER TAKING THIS MEDICATION. 2 tablet 0   amphetamine-dextroamphetamine (ADDERALL XR) 20 MG 24 hr capsule Take 1 capsule (20 mg total) by mouth daily. 30 capsule 0   escitalopram (LEXAPRO) 10 MG tablet Take 1 tablet (10 mg total) by mouth daily. 90 tablet 3   Multiple Vitamin (MULTIVITAMIN ADULT PO) Take by mouth.     mirtazapine (REMERON) 15 MG tablet Take 1 tab at bedtime for appetite and weight gain. (Patient not taking: Reported on 04/14/2023) 30 tablet 2   Omega-3 Fatty Acids (FISH OIL PO) Take by mouth. (Patient not taking: Reported on 04/14/2023)     No current facility-administered medications on file prior to visit.   Allergies:  No Known Allergies Health Maintenance:  Immunization History  Administered Date(s) Administered   Tdap 01/15/2021   Health Maintenance  Topic Date Due   COVID-19 Vaccine (1) Never done   INFLUENZA VACCINE  06/18/2023   DTaP/Tdap/Td (2 - Td or Tdap) 01/16/2031   Hepatitis C Screening  Completed   HIV Screening  Completed   HPV VACCINES  Aged Out   Tetanus: 2022 Covid 19: declines  Colonoscopy: - due age 41  Eye Exam: last 2022, has glasses Dentist: Dr. Arman Filter, last 2023, goes q68m  Patient Care Team: Lucky Cowboy, MD as PCP - General (Internal Medicine) Karie Soda, MD as Consulting Physician (General Surgery) [name], NP as Nurse Practitioner (Nurse Practitioner)  Medical History:  has ADD (attention deficit disorder); Anxiety; Body mass index (BMI) less than 16.5; Protein-calorie malnutrition, severe; Vitamin D deficiency; Current every day vaping; and Severely underweight adult on their problem list. Surgical History:  He  has a past surgical history that includes Mouth surgery; Fetal  surgery for congenital hernia; laparotomy (Right, 06/07/2019); Partial colectomy (Right, 06/07/2019); and Hernia repair (Bilateral, 2019). Family History:  His family history includes Alcohol abuse in his maternal grandmother; Anxiety disorder in his brother; Blindness in his maternal grandmother; Deep vein thrombosis in his maternal grandmother; Diabetes in his father; Hypertension in his father; Lung cancer in his paternal grandmother. Social History:   reports that he quit smoking about 9 years ago. His smoking use included e-cigarettes and cigarettes. He started smoking about 14 years ago. He has a 3.75 pack-year smoking history. He has never used smokeless tobacco. He reports that he does not drink alcohol and does not use drugs.  Review of Systems:  Review of Systems  Constitutional:  Negative for malaise/fatigue and weight loss.  HENT:  Negative for hearing loss and tinnitus.   Eyes:  Negative for blurred vision and double vision.  Respiratory:  Negative for cough, sputum production, shortness of breath and wheezing.   Cardiovascular:  Negative for chest pain, palpitations, orthopnea, claudication, leg swelling and PND.  Gastrointestinal:  Negative for abdominal pain, blood in stool, constipation, diarrhea, heartburn, melena, nausea and vomiting.  Genitourinary: Negative.   Musculoskeletal:  Negative for falls, joint pain and myalgias.  Skin:  Negative for rash.  Neurological:  Negative for dizziness, tingling, sensory change, weakness and headaches.  Endo/Heme/Allergies:  Negative for polydipsia.  Psychiatric/Behavioral:  Negative for depression, memory loss, substance abuse and suicidal ideas. The patient is nervous/anxious (med procedures/blood draws). The patient does not have insomnia.   All other systems reviewed and are negative.   Physical Exam: Estimated body mass index is 15.17 kg/m as calculated from the following:   Height as of this encounter: 5\' 11"  (1.803 m).   Weight  as of this encounter: 108 lb 12.8 oz (49.4 kg). BP 102/72   Pulse 100   Temp 97.8 F (36.6 C)   Ht 5\' 11"  (1.803 m)   Wt 108 lb 12.8 oz (49.4 kg)   SpO2 99%   BMI 15.17 kg/m  General Appearance: Very thin adult male, well dressed, good hygiene, in no apparent distress.  Eyes: PERRLA, EOMs, conjunctiva no swelling or erythema Sinuses: No Frontal/maxillary tenderness  ENT/Mouth: Ext aud canals clear, normal light reflex with TMs without erythema, bulging. Good dentition. No erythema, swelling, or exudate on post pharynx. Tonsils not swollen or erythematous. Hearing normal.  Neck: Supple, thyroid normal. No bruits  Respiratory: Respiratory effort normal, BS equal bilaterally without rales, rhonchi, wheezing or stridor.  Cardio: RRR without murmurs, rubs or gallops. Brisk peripheral pulses without edema.  Chest: symmetric, with normal excursions and percussion.  Abdomen: Soft, nontender, no guarding, rebound, hernias, masses, or organomegaly. Well healed midline surgical scar.  Lymphatics: Non tender without lymphadenopathy.  Genitourinary: declines, no concerns on self checks Musculoskeletal: Full ROM all peripheral extremities,5/5 strength, and normal gait.  Skin: Warm, dry without rashes, lesions, ecchymosis. Neuro: Cranial nerves intact,  reflexes equal bilaterally. Normal muscle tone, no cerebellar symptoms. Sensation intact.  Psych: Awake and oriented X 3, normal affect, Insight and Judgment appropriate.   EKG: Defer  [name], NP-C 9:17 AM [facility]

## 2023-04-14 NOTE — Patient Instructions (Signed)
Malnutrition, Adult Malnutrition, specifically undernutrition, is a condition that can occur gradually or quickly, it usually depends on the cause. Malnutrition is a group of symptoms that affects adults such as: loss of appetite, weakness, fatigue, and weight loss. What are the causes? This condition may be caused by: A chronic disease, such as dementia, diabetes, cancer, or chronic obstructive pulmonary disease. A sudden illness like sepsis, a major surgery, or a traumatic event that puts you in a hospital in critical care. A mental health disorder, such as depression. A disability that limits you from getting or making food. Medicines. Having tooth or mouth problems. Mistreatment or neglect. In some cases, the cause may not be known. What are the signs or symptoms? Symptoms of this condition include: Loss of more than 5% of your body weight. Being more tired than normal after an activity. Loss of appetite. Not getting out of bed. Not wanting to do usual activities. Frequent infections. Fragile skin that results in skin breakdown or bedsores. How is this diagnosed? This condition may be diagnosed with a diet history, physical exam, and tests. Your health care provider will ask questions about your diet, level of physical activity, access to food, health conditions, and typical dietary patterns, such as: Has your diet changed? Do you have any physical limitations or barriers? Are you able to eat, chew, and safely swallow? Tests may also be done. They may include: Blood tests. Urine tests. Imaging tests, such as: X-rays. CT scan. MRI. Tests to check thinking ability (cognitive tests). Activity tests. Your health care provider may want to see if you can do tasks such as bathing and dressing and if you can move around safely. You may be referred to a specialist. How is this treated? Treatment for this condition depends on the cause. It may be treated by: Treating a disease or  disorder that is causing symptoms. Having talk therapy or taking medicine to treat depression. Improving diet, such as by eating more often or taking nutritional supplements. Changing or stopping a medicine. Having physical or occupational therapy. It often takes a team of health care providers to find the right treatment. Follow these instructions at home:  Take over-the-counter and prescription medicines only as told by your health care provider. Eat a healthy, well-balanced diet. Make sure to get enough calories in each meal. Ask your health care provider how many calories you need. Be physically active. Include strength training as part of your exercise routine. A physical therapist can help to set up an exercise program that is right for you. Make sure you have access to food and are able to prepare meals. Have a plan for what to do if you become unable to make decisions for yourself. Contact a health care provider if you: Are not able to eat well. Are not able to move around. Feel very sad or hopeless. Get help right away if: You have thoughts of ending your life. You cannot eat or drink. You do not get out of bed. Staying at home is no longer safe. You have a fever. Get help right away if you feel like you may hurt yourself or others, or have thoughts about taking your own life. Go to your nearest emergency room or: Call 911. Call the National Suicide Prevention Lifeline at 252-775-7661 or 988. This is open 24 hours a day. Text the Crisis Text Line at (214)243-2980. Summary Symptoms include loss of appetite and weight loss. Take over-the-counter and prescription medicines only as told by your  health care provider. Eat a healthy, well-balanced diet. Make sure to get enough calories in each meal. Ask your health care provider how many calories you need. Be physically active. Include strength training as part of your exercise routine. A physical therapist can help to set up an exercise  program that is right for you. This information is not intended to replace advice given to you by your health care provider. Make sure you discuss any questions you have with your health care provider. Document Revised: 07/19/2021 Document Reviewed: 07/19/2021 Elsevier Patient Education  2024 [employer].

## 2023-04-15 ENCOUNTER — Other Ambulatory Visit: Payer: Self-pay | Admitting: Nurse Practitioner

## 2023-04-15 ENCOUNTER — Encounter: Payer: Self-pay | Admitting: Nurse Practitioner

## 2023-04-15 ENCOUNTER — Telehealth: Payer: Self-pay | Admitting: Nurse Practitioner

## 2023-04-15 DIAGNOSIS — F410 Panic disorder [episodic paroxysmal anxiety] without agoraphobia: Secondary | ICD-10-CM

## 2023-04-15 MED ORDER — ALPRAZOLAM 1 MG PO TABS
ORAL_TABLET | ORAL | 0 refills | Status: DC
Start: 2023-04-16 — End: 2023-05-25

## 2023-04-15 NOTE — Telephone Encounter (Signed)
Pt. Is requesting refill on xanax. I saw that he message on mychart already, just wanted to document the call.

## 2023-04-21 ENCOUNTER — Encounter: Payer: Self-pay | Admitting: Nurse Practitioner

## 2023-04-27 ENCOUNTER — Other Ambulatory Visit: Payer: Self-pay | Admitting: Nurse Practitioner

## 2023-04-27 ENCOUNTER — Telehealth: Payer: Self-pay | Admitting: Nurse Practitioner

## 2023-04-27 DIAGNOSIS — F988 Other specified behavioral and emotional disorders with onset usually occurring in childhood and adolescence: Secondary | ICD-10-CM

## 2023-04-27 MED ORDER — AMPHETAMINE-DEXTROAMPHET ER 20 MG PO CP24: 20.0000 mg | ORAL_CAPSULE | Freq: Every day | ORAL | 0 refills | Status: DC

## 2023-04-27 NOTE — Telephone Encounter (Signed)
Pt. Is requesting refill on Adderall. Pls send to [facility] on file.

## 2023-05-15 ENCOUNTER — Encounter: Payer: Self-pay | Admitting: Nurse Practitioner

## 2023-05-15 MED ORDER — MIRTAZAPINE 15 MG PO TABS: ORAL_TABLET | ORAL | 2 refills | Status: DC

## 2023-05-20 ENCOUNTER — Other Ambulatory Visit: Payer: Self-pay | Admitting: Nurse Practitioner

## 2023-05-20 DIAGNOSIS — F988 Other specified behavioral and emotional disorders with onset usually occurring in childhood and adolescence: Secondary | ICD-10-CM

## 2023-05-25 ENCOUNTER — Other Ambulatory Visit: Payer: Self-pay | Admitting: Nurse Practitioner

## 2023-05-25 ENCOUNTER — Telehealth: Payer: Self-pay | Admitting: Nurse Practitioner

## 2023-05-25 DIAGNOSIS — F988 Other specified behavioral and emotional disorders with onset usually occurring in childhood and adolescence: Secondary | ICD-10-CM

## 2023-05-25 DIAGNOSIS — F410 Panic disorder [episodic paroxysmal anxiety] without agoraphobia: Secondary | ICD-10-CM

## 2023-05-25 MED ORDER — ALPRAZOLAM 1 MG PO TABS
ORAL_TABLET | ORAL | 0 refills | Status: DC
Start: 2023-05-25 — End: 2024-01-18

## 2023-05-25 MED ORDER — AMPHETAMINE-DEXTROAMPHET ER 20 MG PO CP24
20.0000 mg | ORAL_CAPSULE | Freq: Every day | ORAL | 0 refills | Status: DC
Start: 2023-05-25 — End: 2023-06-25

## 2023-05-25 NOTE — Telephone Encounter (Signed)
Patient states that this is for his anxiety when they inject his mouth he goes into a full panic attack. Is having 2 fillings done.

## 2023-05-25 NOTE — Telephone Encounter (Signed)
Pt is requesting refill on Adderall and Xanax for a dentist appt. Tmr. [facility].

## 2023-06-25 ENCOUNTER — Encounter: Payer: Self-pay | Admitting: Nurse Practitioner

## 2023-06-25 DIAGNOSIS — F988 Other specified behavioral and emotional disorders with onset usually occurring in childhood and adolescence: Secondary | ICD-10-CM

## 2023-06-25 MED ORDER — MIRTAZAPINE 15 MG PO TABS: ORAL_TABLET | ORAL | 2 refills | Status: DC

## 2023-06-25 MED ORDER — AMPHETAMINE-DEXTROAMPHET ER 20 MG PO CP24
20.0000 mg | ORAL_CAPSULE | Freq: Every day | ORAL | 0 refills | Status: DC
Start: 2023-06-25 — End: 2023-06-26

## 2023-06-26 MED ORDER — AMPHETAMINE-DEXTROAMPHET ER 20 MG PO CP24
20.0000 mg | ORAL_CAPSULE | Freq: Every day | ORAL | 0 refills | Status: DC
Start: 2023-06-26 — End: 2023-08-03

## 2023-08-03 ENCOUNTER — Encounter: Payer: Self-pay | Admitting: Nurse Practitioner

## 2023-08-03 DIAGNOSIS — F988 Other specified behavioral and emotional disorders with onset usually occurring in childhood and adolescence: Secondary | ICD-10-CM

## 2023-08-03 MED ORDER — AMPHETAMINE-DEXTROAMPHET ER 20 MG PO CP24
20.0000 mg | ORAL_CAPSULE | Freq: Every day | ORAL | 0 refills | Status: DC
Start: 2023-08-03 — End: 2023-08-24

## 2023-08-17 ENCOUNTER — Encounter: Payer: Self-pay | Admitting: Nurse Practitioner

## 2023-08-19 ENCOUNTER — Encounter: Payer: Self-pay | Admitting: Nurse Practitioner

## 2023-08-19 ENCOUNTER — Ambulatory Visit (INDEPENDENT_AMBULATORY_CARE_PROVIDER_SITE_OTHER): Payer: Self-pay | Admitting: Nurse Practitioner

## 2023-08-19 VITALS — BP 100/64 | HR 79 | Temp 98.0°F | Ht 71.0 in | Wt 119.2 lb

## 2023-08-19 DIAGNOSIS — Z79899 Other long term (current) drug therapy: Secondary | ICD-10-CM

## 2023-08-19 DIAGNOSIS — F419 Anxiety disorder, unspecified: Secondary | ICD-10-CM

## 2023-08-19 DIAGNOSIS — Z13220 Encounter for screening for lipoid disorders: Secondary | ICD-10-CM

## 2023-08-19 DIAGNOSIS — Z7289 Other problems related to lifestyle: Secondary | ICD-10-CM

## 2023-08-19 DIAGNOSIS — E559 Vitamin D deficiency, unspecified: Secondary | ICD-10-CM

## 2023-08-19 DIAGNOSIS — F902 Attention-deficit hyperactivity disorder, combined type: Secondary | ICD-10-CM

## 2023-08-19 DIAGNOSIS — Z681 Body mass index (BMI) 19 or less, adult: Secondary | ICD-10-CM

## 2023-08-19 NOTE — Patient Instructions (Signed)

## 2023-08-19 NOTE — Progress Notes (Signed)
Follow Up  Assessment and Plan:  Mylin was seen today for a follow up  Diagnoses and all orders for this visit:  Attention deficit disorder Medication no longer effective  Increase to 30 mg ER daily Continue meds; monitor PDMPR closely  Helps with focus, no AE's. The patient was counseled on the addictive nature of the medication and was encouraged to take drug holidays when not needed.   Anxiety Continue Escitalopram.   Reviewed relaxation techniques.  Sleep hygiene. Recommended Cognitive Behavioral Therapy (CBT). Recommended mindfulness meditation and exercise.   Insight-oriented psychotherapy given for 16 minutes exclusively. Psychoeducation:  encouraged personality growth wand development through coping techniques and problem-solving skills. Limit/Decrease/Monitor drug/alcohol intake.    BMI less than 19,adult/ protein calorie malnutrition Weight has increased - BMI now 17.   Continue Remeron Recommend daily protein supplement. Recommended diet heavy in fruits and veggies and low in animal meats, cheeses, and dairy products, appropriate calorie intake Aim for caloric surplus, add resistance exercises Discussed appropriate weight for height  Vitamin D deficiency Continue supplement; defer checking due to cash pay/cost  Current every day vaping Discussed risks associated with tobacco use and advised to reduce or quit Declines Chantix or other medication Smoking cessation instruction/counseling given:  counseled patient on the dangers of tobacco use, advised patient to stop smoking, and reviewed strategies to maximize success  Patient to contact office for a lab draw appointment after obtaining money to pay for labs - he is currently without a job.  Advised we would be unable to continue providing prescriptions without updated blood work.  Notify office for further evaluation and treatment, questions or concerns if any reported s/s fail to improve.   The patient was  advised to call back or seek an in-person evaluation if any symptoms worsen or if the condition fails to improve as anticipated.   Further disposition pending results of labs. Discussed med's effects and SE's.    I discussed the assessment and treatment plan with the patient. The patient was provided an opportunity to ask questions and all were answered. The patient agreed with the plan and demonstrated an understanding of the instructions.  Discussed med's effects and SE's. Screening labs and tests as requested with regular follow-up as recommended.  I provided 20 minutes of face-to-face time during this encounter including counseling, chart review, and critical decision making was preformed.  Today's Plan of Care is based on a patient-centered health care approach known as shared decision making - the decisions, tests and treatments allow for patient preferences and values to be balanced with clinical evidence.     HPI 32 y.o. male patient presents for a general follow up. She has ADD (attention deficit disorder); Anxiety; Body mass index (BMI) less than 16.5; Protein-calorie malnutrition, severe; Vitamin D deficiency; Current every day vaping; and Severely underweight adult on their problem list.   Overall he reports feeling well today.  Partner has recently left him.  He is living alone and feels it is more peaceful. Has 7 cats that he is trying to currently get rid of that was left over from his relationship.    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  This leaves him very stressed, feels as though it  may be a contributor to weight.   06/07/19 volvulus which resulted in bowel resection. He had prior double hernia repair.  Has done well since, takes miralax daily which helps him have a daily  BM Bristol Stool Type 4.  Reports this may also contribute to his weight loss.   He reports chronic anxiety following smoking cessation/chantix several  years ago; much improved with lexapro. He does have xanax to use prior to blood draws.   He quit smoking but has been vaping; working to taper down.   He has long hx of ADD, was prescribed adderall 30 mg XR by psych office but lost insurance, currently on adderall 20 XR mg in AM on work days, if busy will take an additional 1/2 tab.  Does not feel as though the 20 mg dose works.   BMI is Body mass index is 16.63 kg/m., Has had normal TSH. Father with similar body habitus; patient reports low body weight his whole life - weight continues to fluctuate.  Weight has improved over the last 6 months. Wt Readings from Last 3 Encounters:  08/19/23 119 lb 3.2 oz (54.1 kg)  04/14/23 108 lb 12.8 oz (49.4 kg)  09/12/22 115 lb (52.2 kg)   Today their BP is BP: 100/64 He does workout. He denies chest pain, shortness of breath, dizziness.   The cholesterol last visit was:   Lab Results  Component Value Date   CHOL 133 07/06/2014   HDL 51 07/06/2014   LDLCALC 57 07/06/2014   TRIG 125 07/06/2014   CHOLHDL 2.6 07/06/2014   He has been working on diet and exercise for glucose management. Denies diabetic polys. Dad was diagnosed with T2DM later in life. Last A1C in the office was:  Lab Results  Component Value Date   HGBA1C 4.9 07/06/2014   Last GFR: Lab Results  Component Value Date   GFRNONAA 91 08/11/2019   Patient is not currently on Vitamin D supplement.   Lab Results  Component Value Date   VD25OH 26 (L) 08/11/2019       Current Medications:  Current Outpatient Medications on File Prior to Visit  Medication Sig Dispense Refill   amphetamine-dextroamphetamine (ADDERALL XR) 20 MG 24 hr capsule Take 1 capsule (20 mg total) by mouth daily. 30 capsule 0   escitalopram (LEXAPRO) 10 MG tablet Take 1 tablet (10 mg total) by mouth daily. 90 tablet 3   mirtazapine (REMERON) 15 MG tablet Take 1 tab at bedtime for appetite and weight gain. 30 tablet 2   Multiple Vitamin (MULTIVITAMIN ADULT PO)  Take by mouth.     ALPRAZolam (XANAX) 1 MG tablet TAKE 1/2-1 TAB PRIOR TO BLOOD DRAWS OR PROCEDURE AS NEEDED FOR ANXIETY/PANIC ATTACKS. DO NOT DRIVE AFTER TAKING THIS MEDICATION. (Patient not taking: Reported on 08/19/2023) 1 tablet 0   Omega-3 Fatty Acids (FISH OIL PO) Take by mouth. (Patient not taking: Reported on 04/14/2023)     No current facility-administered medications on file prior to visit.   Allergies:  No Known Allergies Health Maintenance:  Immunization History  Administered Date(s) Administered   Tdap 01/15/2021   Health Maintenance  Topic Date Due   COVID-19 Vaccine (1) Never done   INFLUENZA VACCINE  Never done   DTaP/Tdap/Td (2 - Td or Tdap) 01/16/2031   Hepatitis C Screening  Completed   HIV Screening  Completed   HPV VACCINES  Aged Out    Patient Care Team: Lucky Cowboy, MD as PCP - General (Internal Medicine) Karie Soda, MD as Consulting Physician (General Surgery) [name], NP as Nurse Practitioner (Nurse Practitioner)  Medical History:  has ADD (attention deficit disorder); Anxiety; Body mass index (BMI) less than 16.5; Protein-calorie malnutrition, severe; Vitamin  D deficiency; Current every day vaping; and Severely underweight adult on their problem list. Surgical History:  He  has a past surgical history that includes Mouth surgery; Fetal surgery for congenital hernia; laparotomy (Right, 06/07/2019); Partial colectomy (Right, 06/07/2019); and Hernia repair (Bilateral, 2019). Family History:  His family history includes Alcohol abuse in his maternal grandmother; Anxiety disorder in his brother; Blindness in his maternal grandmother; Deep vein thrombosis in his maternal grandmother; Diabetes in his father; Hypertension in his father; Lung cancer in his paternal grandmother. Social History:   reports that he quit smoking about 9 years ago. His smoking use included e-cigarettes and cigarettes. He started smoking about 14 years ago. He has a 3.8  pack-year smoking history. He has never used smokeless tobacco. He reports that he does not drink alcohol and does not use drugs.  Review of Systems:  Review of Systems  Constitutional:  Negative for malaise/fatigue and weight loss.  HENT:  Negative for hearing loss and tinnitus.   Eyes:  Negative for blurred vision and double vision.  Respiratory:  Negative for cough, sputum production, shortness of breath and wheezing.   Cardiovascular:  Negative for chest pain, palpitations, orthopnea, claudication, leg swelling and PND.  Gastrointestinal:  Negative for abdominal pain, blood in stool, constipation, diarrhea, heartburn, melena, nausea and vomiting.  Genitourinary: Negative.   Musculoskeletal:  Negative for falls, joint pain and myalgias.  Skin:  Negative for rash.  Neurological:  Negative for dizziness, tingling, sensory change, weakness and headaches.  Endo/Heme/Allergies:  Negative for polydipsia.  Psychiatric/Behavioral:  Negative for depression, memory loss, substance abuse and suicidal ideas. The patient is nervous/anxious (med procedures/blood draws). The patient does not have insomnia.   All other systems reviewed and are negative.   Physical Exam: Estimated body mass index is 16.63 kg/m as calculated from the following:   Height as of this encounter: 5\' 11"  (1.803 m).   Weight as of this encounter: 119 lb 3.2 oz (54.1 kg). BP 100/64   Pulse 79   Temp 98 F (36.7 C)   Ht 5\' 11"  (1.803 m)   Wt 119 lb 3.2 oz (54.1 kg)   SpO2 99%   BMI 16.63 kg/m  General Appearance: Very thin adult male, well dressed, good hygiene, in no apparent distress.  Eyes: PERRLA, EOMs, conjunctiva no swelling or erythema Sinuses: No Frontal/maxillary tenderness  ENT/Mouth: Ext aud canals clear, normal light reflex with TMs without erythema, bulging. Good dentition. No erythema, swelling, or exudate on post pharynx. Tonsils not swollen or erythematous. Hearing normal.  Neck: Supple, thyroid normal.  No bruits  Respiratory: Respiratory effort normal, BS equal bilaterally without rales, rhonchi, wheezing or stridor.  Cardio: RRR without murmurs, rubs or gallops. Brisk peripheral pulses without edema.  Chest: symmetric, with normal excursions and percussion.  Abdomen: Soft, nontender, no guarding, rebound, hernias, masses, or organomegaly. Well healed midline surgical scar.  Lymphatics: Non tender without lymphadenopathy.  Genitourinary: declines, no concerns on self checks Musculoskeletal: Full ROM all peripheral extremities,5/5 strength, and normal gait.  Skin: Warm, dry without rashes, lesions, ecchymosis. Neuro: Cranial nerves intact, reflexes equal bilaterally. Normal muscle tone, no cerebellar symptoms. Sensation intact.  Psych: Awake and oriented X 3, normal affect, Insight and Judgment appropriate.    [name], NP-C 9:39 AM [facility]

## 2023-08-22 ENCOUNTER — Encounter: Payer: Self-pay | Admitting: Nurse Practitioner

## 2023-08-22 DIAGNOSIS — F902 Attention-deficit hyperactivity disorder, combined type: Secondary | ICD-10-CM

## 2023-08-24 MED ORDER — AMPHETAMINE-DEXTROAMPHET ER 30 MG PO CP24
ORAL_CAPSULE | ORAL | 0 refills | Status: DC
Start: 2023-09-01 — End: 2023-08-31

## 2023-08-31 MED ORDER — AMPHETAMINE-DEXTROAMPHET ER 30 MG PO CP24: ORAL_CAPSULE | ORAL | 0 refills | Status: DC

## 2023-09-24 ENCOUNTER — Encounter: Payer: Self-pay | Admitting: Nurse Practitioner

## 2023-09-24 DIAGNOSIS — F902 Attention-deficit hyperactivity disorder, combined type: Secondary | ICD-10-CM

## 2023-09-24 MED ORDER — AMPHETAMINE-DEXTROAMPHET ER 30 MG PO CP24: ORAL_CAPSULE | ORAL | 0 refills | Status: DC

## 2023-09-28 ENCOUNTER — Telehealth: Payer: Self-pay | Admitting: Nurse Practitioner

## 2023-09-28 NOTE — Telephone Encounter (Signed)
Spoke with patient and he understands that it can't be picked up until the 13th because it is a controlled substance.

## 2023-09-28 NOTE — Telephone Encounter (Signed)
Pt is going out of town for work. Can you refill his adderall pls? Pharm-  [facility] [phone] [place], [place] - [address]

## 2023-10-28 ENCOUNTER — Encounter: Payer: Self-pay | Admitting: Nurse Practitioner

## 2023-10-28 DIAGNOSIS — F902 Attention-deficit hyperactivity disorder, combined type: Secondary | ICD-10-CM

## 2023-10-28 MED ORDER — AMPHETAMINE-DEXTROAMPHET ER 30 MG PO CP24: ORAL_CAPSULE | ORAL | 0 refills | Status: DC

## 2023-11-24 ENCOUNTER — Encounter: Payer: Self-pay | Admitting: Nurse Practitioner

## 2023-11-24 DIAGNOSIS — F902 Attention-deficit hyperactivity disorder, combined type: Secondary | ICD-10-CM

## 2023-11-25 MED ORDER — AMPHETAMINE-DEXTROAMPHET ER 30 MG PO CP24: ORAL_CAPSULE | ORAL | 0 refills | Status: DC

## 2023-12-24 ENCOUNTER — Encounter: Payer: Self-pay | Admitting: Nurse Practitioner

## 2023-12-24 DIAGNOSIS — F902 Attention-deficit hyperactivity disorder, combined type: Secondary | ICD-10-CM

## 2023-12-24 MED ORDER — AMPHETAMINE-DEXTROAMPHET ER 30 MG PO CP24: ORAL_CAPSULE | ORAL | 0 refills | Status: DC

## 2023-12-31 ENCOUNTER — Other Ambulatory Visit: Payer: Self-pay

## 2023-12-31 MED ORDER — MIRTAZAPINE 15 MG PO TABS: ORAL_TABLET | ORAL | 2 refills | Status: DC

## 2024-01-18 ENCOUNTER — Ambulatory Visit: Payer: Self-pay | Admitting: Medical

## 2024-01-18 ENCOUNTER — Encounter: Payer: Self-pay | Admitting: Medical

## 2024-01-18 VITALS — BP 120/70 | HR 84 | Ht 71.0 in | Wt 125.0 lb

## 2024-01-18 DIAGNOSIS — Z681 Body mass index (BMI) 19 or less, adult: Secondary | ICD-10-CM

## 2024-01-18 DIAGNOSIS — R0989 Other specified symptoms and signs involving the circulatory and respiratory systems: Secondary | ICD-10-CM

## 2024-01-18 DIAGNOSIS — F419 Anxiety disorder, unspecified: Secondary | ICD-10-CM

## 2024-01-18 DIAGNOSIS — R630 Anorexia: Secondary | ICD-10-CM

## 2024-01-18 MED ORDER — AMPHETAMINE-DEXTROAMPHET ER 20 MG PO CP24: 20.0000 mg | ORAL_CAPSULE | Freq: Every day | ORAL | 0 refills | Status: DC

## 2024-01-18 MED ORDER — ESCITALOPRAM OXALATE 10 MG PO TABS: 10.0000 mg | ORAL_TABLET | Freq: Every day | ORAL | 0 refills | Status: DC

## 2024-01-18 NOTE — Progress Notes (Signed)
 Subjective:  Austin [name] is a 33 y.o. male who presents for Chief Complaint  Patient presents with   New Patient (Initial Visit)    Get established, new patient. Needs refill on meds.      Here as a new patient.   Was seeing Austin [name] and his PA Austin [name] prior at pediatric and adult medicine.   Unfortunately Austin [name] passed away recently.   Was seen by specialist few years ago to re-assess his situation, was diagnosed with adult ADD.     Was seeing Austin [name] for a while, but this got expensive with visits and routine drug screening.   Austin [name] took over treatment until he passed recently.   Was initially diagnosed by psychiatrist as a child.  Has been on Mirtazapine to help with appetite and some with sleep but was initiated for appetite.  Been on this about 9 months.  Is compliant with Lexapro 10mg  for about 1-2 years, for anxiety.   Is a smoker.  Tried to quit smoking a few years with Chantix and had major chest pain with this medicaiton, was seen by Austin [name] urgent care, had evaluation.   He didn't tolerate chantix due to chest pain and anxiety.    He is not out of medicaiton but needs adderall refill soon.  Works as Personnel officer for past 7 months.   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  No other aggravating or relieving factors.    No other c/o.  Past Medical History:  Diagnosis Date   ADD (attention deficit disorder)    Anxiety    Right inguinal hernia 12/09/2017   Volvulus (HCC) 06/07/2019   Current Outpatient Medications on File Prior to Visit  Medication Sig Dispense Refill   mirtazapine (REMERON) 15 MG tablet Take 1 tab at bedtime for appetite and weight gain. 30 tablet 2   Multiple Vitamin (MULTIVITAMIN ADULT PO) Take by mouth.     Omega-3 Fatty Acids (FISH OIL PO) Take by mouth. (Patient not taking: Reported on 01/18/2024)     No current facility-administered medications on  file prior to visit.     The following portions of the patient's history were reviewed and updated as appropriate: allergies, current medications, past family history, past medical history, past social history, past surgical history and problem list.  ROS Otherwise as in subjective above    Objective: BP 120/70   Pulse 84   Ht 5\' 11"  (1.803 m)   Wt 125 lb (56.7 kg)   SpO2 99%   BMI 17.43 kg/m   General appearance: alert, no distress, well developed, well nourished Neck: supple, right anterior cervical lump possible node but firm approx 1cm diameter, left soft palpable node, otherwise no thyromegaly, no masses Heart: RRR, normal S1, S2, no murmurs Lungs: CTA bilaterally, no wheezes, rhonchi, or rales Pulses: 2+ radial pulses, 2+ pedal pulses, normal cap refill Ext: no edema    Assessment: Encounter Diagnoses  Name Primary?   Anxiety Yes   Lymph node symptom    BMI < 18.5    Appetite impaired      Plan: Discussed concerns, prior medications, diagnoses.   I reviewed his prior records, labs from 2023.   Given his low BMI, we will drop down to Adderall XR 20mg  from 30mg .   Continue Lexapro and mirtazapine as usual, mirtazapine for sleep and appetite.   Counseled on safe use of medicaiton, safekeeping medicaiton, occasional  drug screen, routine follow up .  [name] was seen today for new patient (initial visit).  Diagnoses and all orders for this visit:  Anxiety -     escitalopram (LEXAPRO) 10 MG tablet; Take 1 tablet (10 mg total) by mouth daily.  Lymph node symptom  BMI < 18.5  Appetite impaired  Other orders -     amphetamine-dextroamphetamine (ADDERALL XR) 20 MG 24 hr capsule; Take 1 capsule (20 mg total) by mouth daily.    Follow up: 1-2 mo for CPX

## 2024-02-15 ENCOUNTER — Other Ambulatory Visit: Payer: Self-pay | Admitting: Medical

## 2024-02-15 MED ORDER — AMPHETAMINE-DEXTROAMPHET ER 20 MG PO CP24: 20.0000 mg | ORAL_CAPSULE | Freq: Every day | ORAL | 0 refills | Status: DC

## 2024-03-07 ENCOUNTER — Encounter: Payer: Self-pay | Admitting: Nurse Practitioner

## 2024-03-11 ENCOUNTER — Other Ambulatory Visit: Payer: Self-pay | Admitting: Medical

## 2024-03-11 MED ORDER — AMPHETAMINE-DEXTROAMPHET ER 20 MG PO CP24: 20.0000 mg | ORAL_CAPSULE | Freq: Every day | ORAL | 0 refills | Status: DC

## 2024-03-23 ENCOUNTER — Encounter: Payer: Self-pay | Admitting: Medical

## 2024-03-30 ENCOUNTER — Other Ambulatory Visit: Payer: Self-pay | Admitting: Family

## 2024-04-06 ENCOUNTER — Other Ambulatory Visit: Payer: Self-pay | Admitting: Medical

## 2024-04-06 DIAGNOSIS — F419 Anxiety disorder, unspecified: Secondary | ICD-10-CM

## 2024-04-06 MED ORDER — ESCITALOPRAM OXALATE 10 MG PO TABS: 10.0000 mg | ORAL_TABLET | Freq: Every day | ORAL | 0 refills | Status: DC

## 2024-04-06 MED ORDER — AMPHETAMINE-DEXTROAMPHET ER 20 MG PO CP24: 20.0000 mg | ORAL_CAPSULE | Freq: Every day | ORAL | 0 refills | Status: DC

## 2024-04-06 MED ORDER — MIRTAZAPINE 15 MG PO TABS: ORAL_TABLET | ORAL | 0 refills | Status: DC

## 2024-04-13 ENCOUNTER — Other Ambulatory Visit (HOSPITAL_COMMUNITY): Payer: Self-pay

## 2024-04-13 ENCOUNTER — Telehealth: Payer: Self-pay

## 2024-04-13 NOTE — Telephone Encounter (Signed)
 Pharmacy Patient Advocate Encounter   Received notification from CoverMyMeds that prior authorization for Amphetamine -Dextroamphet ER 20MG  er capsules is required/requested.   Insurance verification completed.   The patient is insured through [facility] .   Per test claim: PA required; PA submitted to above mentioned insurance via CoverMyMeds Key/confirmation #/EOC (Key: Kimber Pencil)    Status is pending

## 2024-04-14 ENCOUNTER — Other Ambulatory Visit (HOSPITAL_COMMUNITY): Payer: Self-pay

## 2024-04-14 NOTE — Telephone Encounter (Signed)
 Pharmacy Patient Advocate Encounter  Received notification from [facility] that Prior Authorization for Amphetamine -Dextroamphet ER 20MG  er capsules has been APPROVED from 5.28.25 to 5.28.27. Ran test claim, Copay is $103.01. This test claim was processed through [facility]- copay amounts may vary at other pharmacies due to pharmacy/plan contracts, or as the patient moves through the different stages of their insurance plan.   PA #/Case ID/Reference #: (Key: [id number])

## 2024-04-29 ENCOUNTER — Other Ambulatory Visit: Payer: Self-pay | Admitting: Medical

## 2024-04-29 NOTE — Telephone Encounter (Signed)
 Pt. Requesting 90 day supply if ok I can send it in.

## 2024-05-05 ENCOUNTER — Other Ambulatory Visit: Payer: Self-pay | Admitting: Medical

## 2024-05-05 MED ORDER — AMPHETAMINE-DEXTROAMPHET ER 20 MG PO CP24: 20.0000 mg | ORAL_CAPSULE | Freq: Every day | ORAL | 0 refills | Status: DC

## 2024-05-05 NOTE — Telephone Encounter (Signed)
 Last apt 01/17/24.

## 2024-05-05 NOTE — Telephone Encounter (Signed)
 Copied from CRM [id number]. Topic: Clinical - Medication Refill >> May 05, 2024  4:06 PM Leory Rands wrote: Medication: amphetamine -dextroamphetamine  (ADDERALL XR) 20 MG 24 hr capsule [[id number]]  Has the patient contacted their pharmacy? Yes (Agent: If no, request that the patient contact the pharmacy for the refill. If patient does not wish to contact the pharmacy document the reason why and proceed with request.) (Agent: If yes, when and what did the pharmacy advise?)  This is the patient's preferred pharmacy:  [id number] [place] [place] - [address] [place] [address] Phone: [phone] Fax: [phone]  Is this the correct pharmacy for this prescription? Yes If no, delete pharmacy and type the correct one.   Has the prescription been filled recently? Yes  Is the patient out of the medication? Yes  Has the patient been seen for an appointment in the last year OR does the patient have an upcoming appointment? Yes  Can we respond through MyChart? Yes  Agent: Please be advised that Rx refills may take up to 3 business days. We ask that you follow-up with your pharmacy.

## 2024-05-16 ENCOUNTER — Telehealth: Payer: Self-pay | Admitting: Internal Medicine

## 2024-05-16 ENCOUNTER — Other Ambulatory Visit: Payer: Self-pay | Admitting: Medical

## 2024-05-16 MED ORDER — ALPRAZOLAM 0.5 MG PO TABS
0.5000 mg | ORAL_TABLET | Freq: Every day | ORAL | 0 refills | Status: DC | PRN
Start: 2024-05-16 — End: 2024-06-23

## 2024-05-16 NOTE — Telephone Encounter (Unsigned)
 Copied from CRM [id number]. Topic: Clinical - Medication Question >> May 16, 2024  8:46 AM Everette C wrote: Reason for CRM: The patient has a root canal scheduled for 05/18/24 and would like to know if they can be prescribed Xanax  to help with potential discomfort and nervousness prior to the procedure. Please contact the patient further if/when possible

## 2024-05-17 NOTE — Telephone Encounter (Signed)
 Pt was notified.

## 2024-06-06 ENCOUNTER — Other Ambulatory Visit: Payer: Self-pay | Admitting: Medical

## 2024-06-06 DIAGNOSIS — F419 Anxiety disorder, unspecified: Secondary | ICD-10-CM

## 2024-06-06 MED ORDER — ESCITALOPRAM OXALATE 10 MG PO TABS: 10.0000 mg | ORAL_TABLET | Freq: Every day | ORAL | 0 refills | Status: DC

## 2024-06-06 MED ORDER — AMPHETAMINE-DEXTROAMPHET ER 20 MG PO CP24: 20.0000 mg | ORAL_CAPSULE | Freq: Every day | ORAL | 0 refills | Status: DC

## 2024-06-14 ENCOUNTER — Encounter: Payer: Self-pay | Admitting: Medical

## 2024-06-14 ENCOUNTER — Ambulatory Visit (INDEPENDENT_AMBULATORY_CARE_PROVIDER_SITE_OTHER): Payer: Self-pay | Admitting: Medical

## 2024-06-14 VITALS — BP 130/70 | HR 100 | Ht 71.75 in | Wt 123.4 lb

## 2024-06-14 DIAGNOSIS — Z79899 Other long term (current) drug therapy: Secondary | ICD-10-CM | POA: Insufficient documentation

## 2024-06-14 DIAGNOSIS — F902 Attention-deficit hyperactivity disorder, combined type: Secondary | ICD-10-CM

## 2024-06-14 DIAGNOSIS — Z7185 Encounter for immunization safety counseling: Secondary | ICD-10-CM

## 2024-06-14 DIAGNOSIS — Z113 Encounter for screening for infections with a predominantly sexual mode of transmission: Secondary | ICD-10-CM | POA: Insufficient documentation

## 2024-06-14 DIAGNOSIS — E559 Vitamin D deficiency, unspecified: Secondary | ICD-10-CM

## 2024-06-14 DIAGNOSIS — Z13220 Encounter for screening for lipoid disorders: Secondary | ICD-10-CM

## 2024-06-14 DIAGNOSIS — F419 Anxiety disorder, unspecified: Secondary | ICD-10-CM

## 2024-06-14 DIAGNOSIS — Z681 Body mass index (BMI) 19 or less, adult: Secondary | ICD-10-CM

## 2024-06-14 DIAGNOSIS — Z0000 Encounter for general adult medical examination without abnormal findings: Secondary | ICD-10-CM

## 2024-06-14 NOTE — Progress Notes (Signed)
 Subjective:   HPI  Austin [name] is a 33 y.o. male who presents for Chief Complaint  Patient presents with   Medical Management of Chronic Issues    Cpe, would like to get blood work done on Friday when he gets Insurance card to bring up here.     Patient Care Team: Page Pucciarelli, Alm GORMAN RIGGERS as PCP - General (Family Medicine) Sheldon Standing, MD as Consulting Physician (General Surgery)   Concerns: Here for well visit.  Doing fine on current medications.  He is compliant with Adderall XR 20 mg daily and Lexapro  10 mg daily.  Seems to do fine on this.  He takes Remeron  for sleep nightly.  He recently was dealing with some fleas in his house but he thinks he is getting rid of them now.  Reviewed their medical, surgical, family, social, medication, and allergy history and updated chart as appropriate.  No Known Allergies  Past Medical History:  Diagnosis Date   ADD (attention deficit disorder)    Anxiety    Right inguinal hernia 12/09/2017   Volvulus (HCC) 06/07/2019    Current Outpatient Medications on File Prior to Visit  Medication Sig Dispense Refill   ALPRAZolam  (XANAX ) 0.5 MG tablet Take 1 tablet (0.5 mg total) by mouth daily as needed for sleep or anxiety. 2 tablet 0   amphetamine -dextroamphetamine  (ADDERALL XR) 20 MG 24 hr capsule Take 1 capsule (20 mg total) by mouth daily. 30 capsule 0   escitalopram  (LEXAPRO ) 10 MG tablet Take 1 tablet (10 mg total) by mouth daily. 90 tablet 0   mirtazapine  (REMERON ) 15 MG tablet TAKE 1 TAB AT BEDTIME FOR APPETITE AND WEIGHT GAIN. 90 tablet 1   Multiple Vitamin (MULTIVITAMIN ADULT PO) Take by mouth.     Omega-3 Fatty Acids (FISH OIL PO) Take by mouth.     No current facility-administered medications on file prior to visit.      Current Outpatient Medications:    ALPRAZolam  (XANAX ) 0.5 MG tablet, Take 1 tablet (0.5 mg total) by mouth daily as needed for sleep or anxiety., Disp: 2 tablet, Rfl: 0   amphetamine -dextroamphetamine   (ADDERALL XR) 20 MG 24 hr capsule, Take 1 capsule (20 mg total) by mouth daily., Disp: 30 capsule, Rfl: 0   escitalopram  (LEXAPRO ) 10 MG tablet, Take 1 tablet (10 mg total) by mouth daily., Disp: 90 tablet, Rfl: 0   mirtazapine  (REMERON ) 15 MG tablet, TAKE 1 TAB AT BEDTIME FOR APPETITE AND WEIGHT GAIN., Disp: 90 tablet, Rfl: 1   Multiple Vitamin (MULTIVITAMIN ADULT PO), Take by mouth., Disp: , Rfl:    Omega-3 Fatty Acids (FISH OIL PO), Take by mouth., Disp: , Rfl:   Family History  Problem Relation Age of Onset   Hypertension Father    Diabetes Father    Anxiety disorder Brother    Alcohol abuse Maternal Grandmother    Blindness Maternal Grandmother        post-traumatic   Deep vein thrombosis Maternal Grandmother    Lung cancer Paternal Grandmother     Past Surgical History:  Procedure Laterality Date   FETAL SURGERY FOR CONGENITAL HERNIA     HERNIA REPAIR Bilateral 2019   Dr. Sheldon   LAPAROTOMY Right 06/07/2019   Procedure: EXPLORATORY LAPAROTOMY;  Surgeon: Belinda Cough, MD;  Location: [facility] OR;  Service: General;  Laterality: Right;   MOUTH SURGERY     PARTIAL COLECTOMY Right 06/07/2019   Procedure: Right Hemi Colectomy;  Surgeon: Belinda Cough, MD;  Location: [facility] OR;  Service: General;  Laterality: Right;     Review of Systems  Constitutional:  Negative for chills, fever, malaise/fatigue and weight loss.  HENT:  Negative for congestion, ear pain, hearing loss, sore throat and tinnitus.   Eyes:  Negative for blurred vision, pain and redness.  Respiratory:  Negative for cough, hemoptysis and shortness of breath.   Cardiovascular:  Negative for chest pain, palpitations, orthopnea, claudication and leg swelling.  Gastrointestinal:  Negative for abdominal pain, blood in stool, constipation, diarrhea, nausea and vomiting.  Genitourinary:  Negative for dysuria, flank pain, frequency, hematuria and urgency.  Musculoskeletal:  Negative for falls, joint pain and myalgias.  Skin:   Negative for itching and rash.  Neurological:  Negative for dizziness, tingling, speech change, weakness and headaches.  Endo/Heme/Allergies:  Negative for polydipsia. Does not bruise/bleed easily.  Psychiatric/Behavioral:  Negative for depression and memory loss. The patient is not nervous/anxious and does not have insomnia.         Objective:  BP 130/70   Pulse 100   Ht 5' 11.75 (1.822 m)   Wt 123 lb 6.4 oz (56 kg)   SpO2 100%   BMI 16.85 kg/m   General appearance: alert, no distress, WD/WN, Caucasian male Skin: unremarkable, tattoo right forearm HEENT: normocephalic, conjunctiva/corneas normal, sclerae anicteric, PERRLA, EOMi, nares patent, no discharge or erythema, pharynx normal Oral cavity: MMM, tongue normal, teeth normal Neck: supple, no lymphadenopathy, no thyromegaly, no masses, normal ROM, no bruits Chest: non tender, normal shape and expansion Heart: RRR, normal S1, S2, no murmurs Lungs: CTA bilaterally, no wheezes, rhonchi, or rales Abdomen: +bs, soft, vertical surgical scar, non tender, non distended, no masses, no hepatomegaly, no splenomegaly, no bruits Back: non tender, normal ROM, no scoliosis Musculoskeletal: upper extremities non tender, no obvious deformity, normal ROM throughout, lower extremities non tender, no obvious deformity, normal ROM throughout Extremities: no edema, no cyanosis, no clubbing Pulses: 2+ symmetric, upper and lower extremities, normal cap refill Neurological: alert, oriented x 3, CN2-12 intact, strength normal upper extremities and lower extremities, sensation normal throughout, DTRs 2+ throughout, no cerebellar signs, gait normal Psychiatric: normal affect, behavior normal, pleasant  GU: normal male external genitalia,circumcised, nontender, no masses, no hernia, no lymphadenopathy Rectal: deferred    Assessment and Plan :   Encounter Diagnoses  Name Primary?   Encounter for health maintenance examination in adult Yes   Vaccine  counseling    Attention deficit hyperactivity disorder (ADHD), combined type    Anxiety    Vitamin D  deficiency    BMI < 18.5    Screen for STD (sexually transmitted disease)    High risk medication use    Screening for lipid disorders     This visit was a preventative care visit, also known as wellness visit or routine physical.   Topics typically include healthy lifestyle, diet, exercise, preventative care, vaccinations, sick and well care, proper use of emergency dept and after hours care, as well as other concerns.     Separate significant issues discussed: He will return for routine labs.  He declined labs today as his insurance card was not available.  He seems to be doing well on his current regimen for anxiety and ADD including Adderall XR 20 mg daily and Lexapro  10 mg daily.  He uses mirtazapine  15 mg at night for sleep   General Recommendations: Continue to return yearly for your annual wellness and preventative care visits.  This gives us  a chance to discuss healthy lifestyle, exercise, vaccinations, review  your chart record, and perform screenings where appropriate.  I recommend you see your eye doctor yearly for routine vision care.  I recommend you see your dentist yearly for routine dental care including hygiene visits twice yearly.   Vaccination  Immunization History  Administered Date(s) Administered   Tdap 01/15/2021    Vaccine recommendations: Yearly flu vaccine   Screening for cancer: Colon cancer screening: Age 68  Testicular cancer screening You should do a monthly self testicular exam if you are between 42-57 years old, and we typically do a testicular exam on the yearly physical for this same age group.   Prostate Cancer screening: The recommended prostate cancer screening test is a blood test called the prostate-specific antigen (PSA) test. PSA is a protein that is made in the prostate. As you age, your prostate naturally produces more PSA.  Abnormally high PSA levels may be caused by: Prostate cancer. An enlarged prostate that is not caused by cancer (benign prostatic hyperplasia, or BPH). This condition is very common in older men. A prostate gland infection (prostatitis) or urinary tract infection. Certain medicines such as male hormones (like testosterone ) or other medicines that raise testosterone  levels. A rectal exam may be done as part of prostate cancer screening to help provide information about the size of your prostate gland. When a rectal exam is performed, it should be done after the PSA level is drawn to avoid any effect on the results.   Skin cancer screening: Check your skin regularly for new changes, growing lesions, or other lesions of concern Come in for evaluation if you have skin lesions of concern.   Lung cancer screening: If you have a greater than 20 pack year history of tobacco use, then you may qualify for lung cancer screening with a chest CT scan.   Please call your insurance company to inquire about coverage for this test.   Pancreatic cancer:  no current screening test is available or routinely recommended. (risk factors: smoking, overweight or obese, diabetes, chronic pancreatitis, work exposure - dry cleaning, metal working, 34yo>, M>F, Tree surgeon, family hx/o, hereditary breast, ovarian, melanoma, lynch, peutz-jeghers).  Symptoms: jaundice, dark urine, light color or greasy stools, itchy skin, belly or back pain, weight loss, poor appetite, nausea, vomiting, liver enlargement, DVT/blood clots.   We currently don't have screenings for other cancers besides breast, cervical, colon, and lung cancers.  If you have a strong family history of cancer or have other cancer screening concerns, please let me know.  Genetic testing referral is an option for individuals with high cancer risk in the family.  There are some other cancer screenings in development currently.   Bone health: Get at least 150  minutes of aerobic exercise weekly Get weight bearing exercise at least once weekly Bone density test:  A bone density test is an imaging test that uses a type of X-ray to measure the amount of calcium and other minerals in your bones. The test may be used to diagnose or screen you for a condition that causes weak or thin bones (osteoporosis), predict your risk for a broken bone (fracture), or determine how well your osteoporosis treatment is working. The bone density test is recommended for females 65 and older, or females or males <65 if certain risk factors such as thyroid disease, long term use of steroids such as for asthma or rheumatological issues, vitamin D  deficiency, estrogen deficiency, family history of osteoporosis, self or family history of fragility fracture in first degree relative.  Heart health: Get at least 150 minutes of aerobic exercise weekly Limit alcohol It is important to maintain a healthy blood pressure and healthy cholesterol numbers  Heart disease screening: Screening for heart disease includes screening for blood pressure, fasting lipids, glucose/diabetes screening, BMI height to weight ratio, reviewed of smoking status, physical activity, and diet.    Goals include blood pressure 120/80 or less, maintaining a healthy lipid/cholesterol profile, preventing diabetes or keeping diabetes numbers under good control, not smoking or using tobacco products, exercising most days per week or at least 150 minutes per week of exercise, and eating healthy variety of fruits and vegetables, healthy oils, and avoiding unhealthy food choices like fried food, fast food, high sugar and high cholesterol foods.    Other tests may possibly include EKG test, CT coronary calcium score, echocardiogram, exercise treadmill stress test.      Medical care options: I recommend you continue to seek care here first for routine care.  We try really hard to have available appointments Monday  through Friday daytime hours for sick visits, acute visits, and physicals.  Urgent care should be used for after hours and weekends for significant issues that cannot wait till the next day.  The emergency department should be used for significant potentially life-threatening emergencies.  The emergency department is expensive, can often have long wait times for less significant concerns, so try to utilize primary care, urgent care, or telemedicine when possible to avoid unnecessary trips to the emergency department.  Virtual visits and telemedicine have been introduced since the pandemic started in 2020, and can be convenient ways to receive medical care.  We offer virtual appointments as well to assist you in a variety of options to seek medical care.    Kinta was seen today for medical management of chronic issues.  Diagnoses and all orders for this visit:  Encounter for health maintenance examination in adult -     TSH + free T4; Future -     CBC; Future -     Comprehensive metabolic panel with GFR; Future -     Lipid panel; Future -     HIV Antibody (routine testing w rflx); Future -     RPR; Future -     GC/Chlamydia Probe Amp; Future -     [id number] U9-Unbund+SVT; Future  Vaccine counseling  Attention deficit hyperactivity disorder (ADHD), combined type -     TSH + free T4; Future  Anxiety  Vitamin D  deficiency  BMI < 18.5 -     TSH + free T4; Future  Screen for STD (sexually transmitted disease) -     HIV Antibody (routine testing w rflx); Future -     RPR; Future -     GC/Chlamydia Probe Amp; Future  High risk medication use -     [id number] U9-Unbund+SVT; Future  Screening for lipid disorders -     Lipid panel; Future     Follow-up pending labs, yearly for physical

## 2024-06-15 NOTE — Progress Notes (Signed)
 Test Includes Amphetamines; Barbiturate; Benzodiazepines; Cannabinoid; Cocaine (Metab.); Opiates; 6-Acetylmorphine; Oxycodone /Oxymorphone; EDDP; Adulteration (Dilution)

## 2024-06-15 NOTE — Progress Notes (Signed)
 Updated immunization record in epic

## 2024-06-23 ENCOUNTER — Other Ambulatory Visit: Payer: Self-pay | Admitting: Medical

## 2024-06-23 ENCOUNTER — Telehealth: Payer: Self-pay | Admitting: Internal Medicine

## 2024-06-23 MED ORDER — ALPRAZOLAM 0.5 MG PO TABS: 0.5000 mg | ORAL_TABLET | Freq: Every day | ORAL | 0 refills | Status: DC | PRN

## 2024-06-23 NOTE — Telephone Encounter (Signed)
 Copied from CRM [id number]. Topic: Clinical - Medication Question >> Jun 23, 2024 11:44 AM Myrick T wrote: Reason for CRM: patient called to see if provider would prescribe a Xanax  for his blood draw that he wants to have tomorrow.

## 2024-06-23 NOTE — Telephone Encounter (Signed)
 Pt was notified.

## 2024-06-24 ENCOUNTER — Other Ambulatory Visit: Payer: Self-pay | Admitting: Medical

## 2024-06-24 ENCOUNTER — Ambulatory Visit: Payer: Self-pay | Admitting: *Deleted

## 2024-06-24 NOTE — Telephone Encounter (Signed)
 FYI Only or Action Required?: Action required by provider: medication refill request.  Patient was last seen in primary care on 06/14/2024 by Bulah Alm RAMAN, PA-C.  Called Nurse Triage reporting increased heart rate (Heart rate).  Symptoms began a week ago.  Interventions attempted: Prescription medications: Adderall.  Symptoms are: gradually worsening.  Triage Disposition: See PCP When Office is Open (Within 3 Days)  Patient/caregiver understands and will follow disposition?: Appointment scheduled - patient requesting RF of emergency xanax  Reason for Disposition  [1] ADHD AND [2] taking stimulant medicine  Answer Assessment - Initial Assessment Questions 1. DESCRIPTION: Please describe your heart rate or heartbeat that you are having (e.g., fast/slow, regular/irregular, skipped or extra beats, palpitations)     Increased heart rate, patient relates to medication SE- last Monday-noticed not helping focus, anxiety, tingling , HR increase. Had to leave work because of panic attack 2. ONSET: When did it start? (e.g., minutes, hours, days)      Usually- 60 minutes after taking medication- Adderall 3. DURATION: How long does it last (e.g., seconds, minutes, hours)     Dissipates after 30 minutes- recently not going away as quickly 4. PATTERN Does it come and go, or has it been constant since it started?  Does it get worse with exertion?   Are you feeling it now?     Comes and goes- making panic attacks worse- patient had to use his xanax    6. HEART RATE: Can you tell me your heart rate? How many beats in 15 seconds?  Note: Not all patients can do this.       80's  7. RECURRENT SYMPTOM: Have you ever had this before? If Yes, ask: When was the last time? and What happened that time?     Adderall SE- may cause anxiety to be worse 8. CAUSE: What do you think is causing the palpitations?     Medication SE 9. CARDIAC HISTORY: Do you have any history of heart  disease? (e.g., heart attack, angina, bypass surgery, angioplasty, arrhythmia)      no 10. OTHER SYMPTOMS: Do you have any other symptoms? (e.g., dizziness, chest pain, sweating, difficulty breathing)       no  Protocols used: Heart Rate and Heartbeat Questions-A-AH   Copied from CRM [id number]. Topic: Clinical - Red Word Triage >> Jun 24, 2024  1:38 PM Yolanda T wrote: Red Word that prompted transfer to Nurse Triage: patient stated his heart rate is high and it feels as if his heart is beating through throat. Patient said he thinks he is on the verge of a panic attack.

## 2024-06-27 ENCOUNTER — Ambulatory Visit (INDEPENDENT_AMBULATORY_CARE_PROVIDER_SITE_OTHER): Payer: Self-pay | Admitting: Medical

## 2024-06-27 VITALS — BP 112/68 | HR 115 | Wt 120.6 lb

## 2024-06-27 DIAGNOSIS — F902 Attention-deficit hyperactivity disorder, combined type: Secondary | ICD-10-CM

## 2024-06-27 DIAGNOSIS — F410 Panic disorder [episodic paroxysmal anxiety] without agoraphobia: Secondary | ICD-10-CM

## 2024-06-27 DIAGNOSIS — F419 Anxiety disorder, unspecified: Secondary | ICD-10-CM

## 2024-06-27 MED ORDER — ATOMOXETINE HCL 25 MG PO CAPS: 25.0000 mg | ORAL_CAPSULE | Freq: Every day | ORAL | 1 refills | Status: DC

## 2024-06-27 MED ORDER — ESCITALOPRAM OXALATE 20 MG PO TABS: 20.0000 mg | ORAL_TABLET | Freq: Every day | ORAL | 2 refills | Status: DC

## 2024-06-27 MED ORDER — ALPRAZOLAM 0.5 MG PO TABS: 0.5000 mg | ORAL_TABLET | Freq: Every day | ORAL | 0 refills | Status: DC | PRN

## 2024-06-27 NOTE — Progress Notes (Signed)
 Subjective:  Austin [name] is a 33 y.o. male who presents for Chief Complaint  Patient presents with   Acute Visit    Adderall has caused rapid heart rate. Usually does this when its activated and it will go away but recently its over his normal heart rate. Been working on breathing excerises today.       Here for anxiety.   He was suppose to come in for fasting labs today from recent well visit.  He called a few days ago to have some xanax  to help calm him before coming in for labs in which he is terrified.    However, due to panic feeling a few days ago, he took the xanax  that was already called out.  He continues on Adderall.  He has been on this for several years but he thinks it helps.  However he also feels like it worsens his anxiety and makes his heartbeat too fast   He actually had to leave work today feeling anxious and panicky.  He is compliant with Lexapro .  In general he is content but sometimes he feels down or agitated.  No HI, no SI.  He does not really have any goals that he is working towards.  He is dating, has a girlfriend.  He has been on Wellbutrin  before but did not like the way it made him feel.  Coping skills can include watching TV, doing circuit chores around the house.  Otherwise he just has a ride it out if the Adderall makes him overly anxious.  No other aggravating or relieving factors.    No other c/o.  Past Medical History:  Diagnosis Date   ADD (attention deficit disorder)    Anxiety    Right inguinal hernia 12/09/2017   Volvulus (HCC) 06/07/2019    Current Outpatient Medications on File Prior to Visit  Medication Sig Dispense Refill   mirtazapine  (REMERON ) 15 MG tablet TAKE 1 TAB AT BEDTIME FOR APPETITE AND WEIGHT GAIN. 90 tablet 1   Multiple Vitamin (MULTIVITAMIN ADULT PO) Take by mouth.     Omega-3 Fatty Acids (FISH OIL PO) Take by mouth.     No current facility-administered medications on file prior to visit.     The following  portions of the patient's history were reviewed and updated as appropriate: allergies, current medications, past family history, past medical history, past social history, past surgical history and problem list.  ROS Otherwise as in subjective above   Objective: BP 112/68   Pulse (!) 115   Wt 120 lb 9.6 oz (54.7 kg)   SpO2 98%   BMI 16.47 kg/m   General appearance: alert, no distress, well developed, well nourished Heart: RRR, normal S1, S2, no murmurs Lungs: CTA bilaterally, no wheezes, rhonchi, or rales Pulses: 2+ radial pulses, 2+ pedal pulses, normal cap refill Ext: no edema Psych: Pleasant, answers question appropriate  EKG reviewed, incomplete right bundle branch block,sinus tachycardia   Assessment: Encounter Diagnoses  Name Primary?   Anxiety Yes   Attention deficit hyperactivity disorder (ADHD), combined type    Panic      Plan: Discussed symptoms and concerns  Advised to go ahead and established with counseling.  There is a counselor he used to see you he is going to try to reach out to them first.  Discussed goalsetting, regular exercise, relaxation techniques, discussed medications.  Stop Adderall.  Increase Lexapro  to 20.  Begin trial of Strattera  therapy and focus.  Discussed risk and benefits and proper  use of medication.  Can use some short-term Xanax  prior to coming back in for lab draw.  [name] was seen today for acute visit.  Diagnoses and all orders for this visit:  Anxiety  Attention deficit hyperactivity disorder (ADHD), combined type  Panic  Other orders -     escitalopram  (LEXAPRO ) 20 MG tablet; Take 1 tablet (20 mg total) by mouth daily. -     ALPRAZolam  (XANAX ) 0.5 MG tablet; Take 1 tablet (0.5 mg total) by mouth daily as needed for sleep or anxiety. -     atomoxetine  (STRATTERA ) 25 MG capsule; Take 1 capsule (25 mg total) by mouth daily.    Follow up: tomorrow for fasting labs

## 2024-06-27 NOTE — Telephone Encounter (Signed)
 Copied from CRM [id number]. Topic: Clinical - Medication Question >> Jun 23, 2024 11:44 AM Myrick T wrote: Reason for CRM: patient called to see if provider would prescribe a Xanax  for his blood draw that he wants to have tomorrow. >> Jun 27, 2024 10:05 AM Graeme ORN wrote: Patient called back. States he spoke with nurse and they told him to request refill through pharmacy. Pharmacy did request. Not yet sent. Patient states he had an issue with medication and had to take those two that were sent in last week for something else  but thinks he is getting blood draw at appt and may need a refill for the blood draw. Thank You

## 2024-06-27 NOTE — Patient Instructions (Signed)
Counseling Services  [facility] Mar[facility] Regal [phone] [address] [place], [place] [address]   [facility], counseling [address] [place], [place] [address] [phone]   [facility] [phone] [address],  [place], [place] [address]   Dr. Len Blalock Child and teen psychiatrist [address], [place], [place] [address] [phone]   Dr. Nicole Cella, [place] [phone] [place], [place] [address]   [facility] [address] [place], [place] [address] [phone]    [facility] Crisis Line and Main phone number [phone]  [facility]  [phone]  [facility] [phone]  Adult Crisis Center [phone]   [facility] [address] [place], [place], [place] [address] [phone]  [facility],  [address],  [place], [place] [address] [phone]    [facility] [phone] [address] [place], [place], [place] [address]   [facility] [phone] office [address] [place], [place] [address] Crisis services, Family support, in home therapy, treatment for Anxiety, PTSD, Sexual Assault, Substance Abuse, Financial/Credit Counseling, Variety of other services     Other Resources  If you are experiencing a mental health crisis or an emergency, please call 911 or go to the nearest emergency department.  [facility]   [phone] [facility]  [phone] [facility]   [phone]  Suicide Hotline 1-800-Suicide (630)410-8066)  National Suicide Prevention Lifeline 623-658-6650  (508)507-8219)  Domestic Violence, Rape/Crisis - [facility] [place] 892-119-4174  [employer] Violence Hotline 1-800-799-SAFE 618-116-3114)  To report Child or Elder Abuse, please call: [facility] Police Department  [phone] [facility] Department  [phone]  Teen Crisis line [phone] or [phone]

## 2024-06-27 NOTE — Telephone Encounter (Signed)
 Pt coming in for panic attacks

## 2024-06-28 ENCOUNTER — Other Ambulatory Visit: Payer: Self-pay

## 2024-06-28 NOTE — Addendum Note (Signed)
 Addended by: BULAH ALM RAMAN on: 06/28/2024 04:40 PM   Modules accepted: Orders

## 2024-06-29 ENCOUNTER — Other Ambulatory Visit: Payer: Self-pay

## 2024-06-29 DIAGNOSIS — Z79899 Other long term (current) drug therapy: Secondary | ICD-10-CM

## 2024-06-29 DIAGNOSIS — Z13220 Encounter for screening for lipoid disorders: Secondary | ICD-10-CM

## 2024-06-29 DIAGNOSIS — Z681 Body mass index (BMI) 19 or less, adult: Secondary | ICD-10-CM

## 2024-06-29 DIAGNOSIS — F902 Attention-deficit hyperactivity disorder, combined type: Secondary | ICD-10-CM

## 2024-06-29 DIAGNOSIS — Z113 Encounter for screening for infections with a predominantly sexual mode of transmission: Secondary | ICD-10-CM

## 2024-06-29 DIAGNOSIS — Z0000 Encounter for general adult medical examination without abnormal findings: Secondary | ICD-10-CM

## 2024-06-30 ENCOUNTER — Ambulatory Visit: Payer: Self-pay | Admitting: Medical

## 2024-06-30 LAB — 739013 U9-UNBUND+SVT
6-Acetylmorphine, Urine: NEGATIVE ng/mL
Amphetamine Screen, Urine: NEGATIVE ng/mL
BENZODIAZ UR QL: NEGATIVE ng/mL
Barbiturate screen, urine: NEGATIVE ng/mL
Cannabinoid: NEGATIVE ng/mL
Cocaine (Metab.), Urine: NEGATIVE ng/mL
Creatinine, Urine: 15.6 mg/dL — ABNORMAL LOW (ref 20.0–300.0)
EDDP, Urine: NEGATIVE ng/mL
Nitrite Urine, Quantitative: NEGATIVE ug/mL
OPIATE SCREEN URINE: NEGATIVE ng/mL
Oxycodone/Oxymorphone, Urine: NEGATIVE ng/mL
pH, Urine: 6.5 (ref 4.5–8.9)

## 2024-06-30 LAB — CBC
Hematocrit: 42.6 % (ref 37.5–51.0)
Hemoglobin: 14.4 g/dL (ref 13.0–17.7)
MCH: 31.2 pg (ref 26.6–33.0)
MCHC: 33.8 g/dL (ref 31.5–35.7)
MCV: 92 fL (ref 79–97)
Platelets: 175 10*3/uL (ref 150–450)
RBC: 4.61 x10E6/uL (ref 4.14–5.80)
RDW: 11.9 % (ref 11.6–15.4)
WBC: 7.5 10*3/uL (ref 3.4–10.8)

## 2024-06-30 LAB — COMPREHENSIVE METABOLIC PANEL WITH GFR
ALT: 12 [IU]/L (ref 0–44)
AST: 17 [IU]/L (ref 0–40)
Albumin: 4.7 g/dL (ref 4.1–5.1)
Alkaline Phosphatase: 77 [IU]/L (ref 44–121)
BUN/Creatinine Ratio: 10 (ref 9–20)
BUN: 9 mg/dL (ref 6–20)
Bilirubin Total: 0.3 mg/dL (ref 0.0–1.2)
CO2: 23 mmol/L (ref 20–29)
Calcium: 9.5 mg/dL (ref 8.7–10.2)
Chloride: 101 mmol/L (ref 96–106)
Creatinine, Ser: 0.93 mg/dL (ref 0.76–1.27)
Globulin, Total: 1.9 g/dL (ref 1.5–4.5)
Glucose: 86 mg/dL (ref 70–99)
Potassium: 4.3 mmol/L (ref 3.5–5.2)
Sodium: 141 mmol/L (ref 134–144)
Total Protein: 6.6 g/dL (ref 6.0–8.5)
eGFR: 111 mL/min/{1.73_m2}

## 2024-06-30 LAB — HIV ANTIBODY (ROUTINE TESTING W REFLEX): HIV Screen 4th Generation wRfx: NONREACTIVE

## 2024-06-30 LAB — LIPID PANEL
Chol/HDL Ratio: 2.8 ratio (ref 0.0–5.0)
Cholesterol, Total: 158 mg/dL (ref 100–199)
HDL: 56 mg/dL
LDL Chol Calc (NIH): 84 mg/dL (ref 0–99)
Triglycerides: 99 mg/dL (ref 0–149)
VLDL Cholesterol Cal: 18 mg/dL (ref 5–40)

## 2024-06-30 LAB — TSH+FREE T4
Free T4: 1.1 ng/dL (ref 0.82–1.77)
TSH: 1.67 u[IU]/mL (ref 0.450–4.500)

## 2024-06-30 LAB — SYPHILIS: RPR W/REFLEX TO RPR TITER AND TREPONEMAL ANTIBODIES, TRADITIONAL SCREENING AND DIAGNOSIS ALGORITHM: RPR Ser Ql: NONREACTIVE

## 2024-06-30 LAB — SPECIFIC GRAVITY: Specific Gravity: 1.0041

## 2024-06-30 NOTE — Progress Notes (Signed)
 Results through MyChart

## 2024-07-01 ENCOUNTER — Other Ambulatory Visit: Payer: Self-pay | Admitting: Medical

## 2024-07-01 LAB — GC/CHLAMYDIA PROBE AMP
Chlamydia trachomatis, NAA: NEGATIVE
Neisseria Gonorrhoeae by PCR: NEGATIVE

## 2024-07-01 NOTE — Progress Notes (Signed)
 Still pending gonorrhea and Chlamydia test  Please verify the last time he took Adderall.  For example has he taken it daily up until his recent visit with me?  Similarly when did he last take Xanax ?   After asking those questions and document what he says, then talk about the following.  We have to do drug screens periodically when you are on a controlled substance.  My license is on the line when I prescribe things like Adderall  Your drug screen looks suspicious.  First,  amphetamine  Adderall or Xanax  benzodiazepine does NOT show up at all and your urine.  The creatinine and specific gravity of urine is quite low suggesting dilution with water or an improper specimen such as using someone else's urine.   Please ask him to explain.

## 2024-07-04 NOTE — Progress Notes (Signed)
 Results through my chart

## 2024-07-19 ENCOUNTER — Other Ambulatory Visit: Payer: Self-pay | Admitting: Medical

## 2024-08-16 ENCOUNTER — Encounter: Payer: Self-pay | Admitting: Nurse Practitioner

## 2024-08-18 ENCOUNTER — Encounter: Payer: Self-pay | Admitting: Nurse Practitioner

## 2024-08-27 ENCOUNTER — Other Ambulatory Visit: Payer: Self-pay | Admitting: Nurse Practitioner

## 2024-08-29 NOTE — Telephone Encounter (Signed)
 Last apt 06/14/24.

## 2024-11-22 ENCOUNTER — Ambulatory Visit (INDEPENDENT_AMBULATORY_CARE_PROVIDER_SITE_OTHER): Payer: Self-pay | Admitting: Nurse Practitioner

## 2024-11-22 ENCOUNTER — Encounter: Payer: Self-pay | Admitting: Nurse Practitioner

## 2024-11-22 VITALS — BP 112/74 | HR 113 | Wt 124.0 lb

## 2024-11-22 DIAGNOSIS — R002 Palpitations: Secondary | ICD-10-CM

## 2024-11-22 DIAGNOSIS — F419 Anxiety disorder, unspecified: Secondary | ICD-10-CM

## 2024-11-22 MED ORDER — BUSPIRONE HCL 5 MG PO TABS: ORAL_TABLET | ORAL | 1 refills | Status: AC

## 2024-11-22 NOTE — Patient Instructions (Signed)

## 2024-11-22 NOTE — Progress Notes (Signed)
 "  [name], DNP, AGNP-c [facility] [address] [place], [place] [address] [phone]   ACUTE VISIT : Acute or New Concern Visit on 11/22/2024  Blood pressure 112/74, pulse (!) 113, weight 124 lb (56.2 kg).   Subjective:  other (Heart palpitations, has anxiety specifically around medical issues, but anxiety has been a little better lately, family history of heart disease, was having issues with Adderall making heart race, has been vapping for a long time but stopped it about a month, since he stopped the vapping it has got better, had a dull ache on lt. Side in chest maybe had been from 4 cups of coffee has had, )   History of Present Illness Austin [name] is a 34 year old male who presents with heart palpitations.  He experiences heart palpitations described as feeling like his heart 'skips a beat' followed by a 'thump.' These episodes are often accompanied by a wave of anxiety. Initially, he experienced these palpitations multiple times a day, but after quitting vaping about a month ago, the frequency has significantly decreased to once or twice a day, with some days having no episodes at all. He has not had any symptoms today or yesterday.   He has a history of heavy vaping, using 50 mg disposable vapes every five days, which he believes contributed to his symptoms. He occasionally uses 3 mg nicotine  pouches to manage withdrawal symptoms and denies any negative side effects with this.   He consumes caffeine (3-5 cups of coffee a day) and notes sensitivity to it and other stimulants. On the day of the visit, he drank two cups of coffee without issues, but previously, consuming five cups led to chest pain, which his father attributed to caffeine intake.He is considering reducing caffeine intake.   He has a history of anxiety and previously experienced panic attacks, which have decreased since starting Lexapro . He has not had a panic attack in a long time. He also  notes a history of using Adderall, which he discontinued due to panic attacks and tachycardia, and Strattera , which he stopped due to side effects.  He mentions a family history of heart issues, which heightens his concern about his symptoms.  No dizziness, sweating, or intense chest heaviness. No abdominal pain.  He reports feeling anxious during the visit, which he attributes to being at the doctor's office.  ROS negative except for what is listed in HPI. History, Medications, Surgery, SDOH, and Family History reviewed and updated as appropriate.  Objective:  Physical Exam Vitals and nursing note reviewed.  Constitutional:      General: He is not in acute distress.    Appearance: Normal appearance. He is not ill-appearing, toxic-appearing or diaphoretic.  HENT:     Head: Normocephalic.  Eyes:     Pupils: Pupils are equal, round, and reactive to light.  Neck:     Vascular: No carotid bruit.  Cardiovascular:     Rate and Rhythm: Normal rate and regular rhythm.     Pulses: Normal pulses.     Heart sounds: Normal heart sounds. No murmur heard.    No gallop.  Pulmonary:     Effort: Pulmonary effort is normal.     Breath sounds: Normal breath sounds. No wheezing or rhonchi.  Chest:     Chest wall: No tenderness.  Abdominal:     General: Bowel sounds are normal. There is no distension.     Palpations: Abdomen is soft.     Tenderness: There  is no abdominal tenderness. There is no guarding.  Musculoskeletal:     Cervical back: No tenderness.     Right lower leg: No edema.     Left lower leg: No edema.  Lymphadenopathy:     Cervical: No cervical adenopathy.  Skin:    General: Skin is warm and dry.     Capillary Refill: Capillary refill takes less than 2 seconds.  Neurological:     Mental Status: He is alert and oriented to person, place, and time.     Sensory: No sensory deficit.     Motor: No weakness.     Coordination: Coordination normal.  Psychiatric:        Attention and  Perception: Attention normal.        Mood and Affect: Mood is anxious.        Speech: Speech normal.        Behavior: Behavior normal. Behavior is cooperative.        Thought Content: Thought content normal.        Judgment: Judgment normal.         Assessment & Plan:   Assessment & Plan Anxiety Chronic anxiety with episodes of palpitations and panic attacks. Anxiety exacerbated by caffeine and nicotine  use. Lexapro  has reduced frequency of panic attacks. Anxiety triggers include medical visits and dental procedures. Discussed potential use of Buspar  as an adjunct to Lexapro  for anxiety management. Xanax  previously used but has potential for addiction and side effects. Buspar  is non-habit forming and can be used as needed or daily. We discussed the option to start this as needed, but ok to increase to daily dosing if he feels this is helpful.  - Prescribed Buspar  for anxiety management, to be taken as needed or twice daily. - Continue Lexapro  for anxiety management. - Monitor response to Buspar  and adjust dosage as needed. Orders:   busPIRone  (BUSPAR ) 5 MG tablet; May take 1 tablet by mouth up to twice a day for anxiety.  Palpitations Intermittent palpitations likely related to excessive nicotine  use from vaping, caffeine sensitivity, and anxiety. Symptoms improved significantly after cessation of vaping. No current dizziness, sweating, or chest heaviness. Heart sounds normal with no irregular beats or murmurs. Lungs are clear. No GI tenderness. No chest wall tenderness. Differential includes PVCs or PACs, anxiety, and caffeine/nicotine -induced palpitations. Thyroid function and other labs from previous visit normal. Previous EKG normal. Discussed with patient the increased risk of tachycardia and irregular rhythm with use of stimulants and nicotine . Patient praised for quitting vaping. Discussed switching to half-caf coffee to help reduce the caffeine. Reassurance provided that there were no  concerning symptoms present at this time. We discussed intermittent PVC's and PAC's and the importance of repeat evaluation if he feels he is having a return of palpitations despite changes in lifestyle.  - Continue to avoid excessive caffeine intake. - Consider switching to half-caf coffee to reduce caffeine intake. - Monitor for any recurrence of symptoms.      Aleene Swanner E Bobie Kistler, DNP, AGNP-c       "

## 2024-11-22 NOTE — Assessment & Plan Note (Addendum)
 Chronic anxiety with episodes of palpitations and panic attacks. Anxiety exacerbated by caffeine and nicotine  use. Lexapro  has reduced frequency of panic attacks. Anxiety triggers include medical visits and dental procedures. Discussed potential use of Buspar  as an adjunct to Lexapro  for anxiety management. Xanax  previously used but has potential for addiction and side effects. Buspar  is non-habit forming and can be used as needed or daily. We discussed the option to start this as needed, but ok to increase to daily dosing if he feels this is helpful.  - Prescribed Buspar  for anxiety management, to be taken as needed or twice daily. - Continue Lexapro  for anxiety management. - Monitor response to Buspar  and adjust dosage as needed. Orders:   busPIRone  (BUSPAR ) 5 MG tablet; May take 1 tablet by mouth up to twice a day for anxiety.

## 2024-12-07 ENCOUNTER — Other Ambulatory Visit: Payer: Self-pay | Admitting: Medical

## 2024-12-23 ENCOUNTER — Ambulatory Visit: Payer: Self-pay | Admitting: Medical
